# Patient Record
Sex: Male | Born: 1970 | Race: Asian | Hispanic: No | Smoking: Never smoker
Health system: Southern US, Community
[De-identification: ages and names within clinical notes are randomized; demographics above are authoritative.]

## PROBLEM LIST (undated history)

## (undated) DIAGNOSIS — F319 Bipolar disorder, unspecified: Secondary | ICD-10-CM

## (undated) DIAGNOSIS — F419 Anxiety disorder, unspecified: Secondary | ICD-10-CM

---

## 2004-05-10 ENCOUNTER — Emergency Department (HOSPITAL_COMMUNITY): Admission: EM | Admit: 2004-05-10 | Discharge: 2004-05-10 | Payer: Self-pay | Admitting: Emergency Medicine

## 2006-12-07 ENCOUNTER — Ambulatory Visit: Payer: Self-pay | Admitting: Psychiatry

## 2006-12-07 ENCOUNTER — Emergency Department (HOSPITAL_COMMUNITY): Admission: EM | Admit: 2006-12-07 | Discharge: 2006-12-07 | Payer: Self-pay | Admitting: Emergency Medicine

## 2006-12-07 ENCOUNTER — Inpatient Hospital Stay (HOSPITAL_COMMUNITY): Admission: AD | Admit: 2006-12-07 | Discharge: 2006-12-10 | Payer: Self-pay | Admitting: Psychiatry

## 2011-10-12 ENCOUNTER — Emergency Department (INDEPENDENT_AMBULATORY_CARE_PROVIDER_SITE_OTHER): Payer: BC Managed Care – PPO

## 2011-10-12 ENCOUNTER — Encounter (HOSPITAL_BASED_OUTPATIENT_CLINIC_OR_DEPARTMENT_OTHER): Payer: Self-pay | Admitting: Emergency Medicine

## 2011-10-12 ENCOUNTER — Emergency Department (HOSPITAL_BASED_OUTPATIENT_CLINIC_OR_DEPARTMENT_OTHER)
Admission: EM | Admit: 2011-10-12 | Discharge: 2011-10-12 | Disposition: A | Discharge status: Home / Self Care | Payer: BC Managed Care – PPO | Attending: Emergency Medicine | Admitting: Emergency Medicine

## 2011-10-12 DIAGNOSIS — X58XXXA Exposure to other specified factors, initial encounter: Secondary | ICD-10-CM

## 2011-10-12 DIAGNOSIS — M79609 Pain in unspecified limb: Secondary | ICD-10-CM

## 2011-10-12 DIAGNOSIS — M79673 Pain in unspecified foot: Secondary | ICD-10-CM

## 2011-10-12 DIAGNOSIS — M25579 Pain in unspecified ankle and joints of unspecified foot: Secondary | ICD-10-CM | POA: Insufficient documentation

## 2011-10-12 DIAGNOSIS — S8990XA Unspecified injury of unspecified lower leg, initial encounter: Secondary | ICD-10-CM

## 2011-10-12 MED ORDER — IBUPROFEN 600 MG PO TABS: 600.0000 mg | ORAL_TABLET | Freq: Four times a day (QID) | ORAL | Status: AC | PRN

## 2011-10-12 MED ORDER — OXYCODONE-ACETAMINOPHEN 5-325 MG PO TABS: 2.0000 | ORAL_TABLET | ORAL | Status: AC | PRN

## 2011-10-12 NOTE — ED Notes (Signed)
inj to rt foot x 3 weeks ago   Continues to be painful and swollen

## 2011-10-12 NOTE — ED Notes (Signed)
Pedal pulses noted bilat. 

## 2011-10-12 NOTE — ED Provider Notes (Signed)
History     CSN: [id number]  Arrival date & time 10/12/11  1443   First MD Initiated Contact with Patient 10/12/11 1510      Chief Complaint  Patient presents with  . Foot Injury    (Consider location/radiation/quality/duration/timing/severity/associated sxs/prior treatment) Patient is a 41 y.o. male presenting with foot injury. The history is provided by the patient.  Foot Injury    Patient presents with foot injury sustained 3 weeks ago. Has been using a foot brace for this and his symptoms were improving. Patient yesterday return to activity and now has increasing pain to the sole of his right foot. Pain is sharp and worse with standing and movement better with rest. Patient wasn't treated for the initial injury. Some swelling noted at the midfoot as well. No ankle pain. Pain does radiate up into his leg History reviewed. No pertinent past medical history.  History reviewed. No pertinent past surgical history.  Family History  Problem Relation Age of Onset  . Diabetes Mother     History  Substance Use Topics  . Smoking status: Not on file  . Smokeless tobacco: Not on file  . Alcohol Use: No      Review of Systems  All other systems reviewed and are negative.    Allergies  Review of patient's allergies indicates no known allergies.  Home Medications   Current Outpatient Rx  Name Route Sig Dispense Refill  . DIVALPROEX SODIUM ER 500 MG PO TB24 Oral Take 2,000 mg by mouth every evening.    . IBUPROFEN 200 MG PO TABS Oral Take 800 mg by mouth 3 (three) times daily as needed. For pain    . LORAZEPAM 0.5 MG PO TABS Oral Take 0.5 mg by mouth 3 (three) times daily as needed. For anxiety      BP 139/82  Pulse 93  Temp(Src) 98.1 F (36.7 C) (Oral)  Resp 20  Ht 5\' 10"  (1.778 m)  Wt 233 lb (105.688 kg)  BMI 33.43 kg/m2  SpO2 98%  Physical Exam  Nursing note and vitals reviewed. Constitutional: He is oriented to person, place, and time. He appears  well-developed and well-nourished.  Non-toxic appearance.  HENT:  Head: Normocephalic and atraumatic.  Eyes: Conjunctivae are normal. Pupils are equal, round, and reactive to light.  Neck: Normal range of motion.  Cardiovascular: Normal rate.   Pulmonary/Chest: Effort normal.  Musculoskeletal:       Right ankle: Achilles tendon normal.       Right foot: He exhibits tenderness.       Feet:  Neurological: He is alert and oriented to person, place, and time.  Skin: Skin is warm and dry.  Psychiatric: He has a normal mood and affect.    ED Course  Procedures (including critical care time)  Labs Reviewed - No data to display No results found.   No diagnosis found.    MDM  Patient to be due for suspected stress fracture of his foot. We placed in a Cam Walker and given referral to orthopedics on call        Toy Blank, MD 10/12/11 1616

## 2014-03-11 ENCOUNTER — Encounter: Payer: Self-pay | Admitting: Podiatry

## 2014-03-11 ENCOUNTER — Ambulatory Visit (INDEPENDENT_AMBULATORY_CARE_PROVIDER_SITE_OTHER): Payer: Self-pay | Admitting: Podiatry

## 2014-03-11 VITALS — Ht 69.0 in | Wt 238.0 lb

## 2014-03-11 DIAGNOSIS — L600 Ingrowing nail: Secondary | ICD-10-CM

## 2014-03-11 NOTE — Progress Notes (Signed)
   Subjective:    Patient ID: William Wilkins, male    DOB: 05/18/1969, 43 y.o.   MRN: [id number]  HPI Comments: N ingrown toenail L right 1st lateral toenail border D 1 month O worsening C encurvated thick toenail, pain and history of drainage A enclosed shoes T H202, cleansing daily     Review of Systems  Psychiatric/Behavioral: The patient is nervous/anxious.   All other systems reviewed and are negative.      Objective:   Physical Exam  Anxious orientated x3 white male presents with his wife  Vascular: DP and PT pulses 2/4 bilaterally  Neurological: Ankle reflexes equal and reactive bilaterally  Dermatological: The lateral margin of the right hallux nail is incurvated with low-grade erythema, edema and slight drainage.  Musculoskeletal: No deformities noted      Assessment & Plan:   Assessment: Ingrowing lateral margin of right hallux nail with associated low-grade paronychia  Plan: Offered patient permanent nail removal and he verbally consents. The right hallux is then blocked with 4 cc of 50-50 mixture of 2% plain Xylocaine and 0.5% plain Marcaine. Right hallux is painted with Betadine and exsanguinated. A lateral margin of the right hallux nail was excised and a phenol matricectomy performed. An antibiotic compression dressing was applied. The tourniquet was released and spontaneous capillary filling time noted in the right hallux.  Patient tolerated procedure well without any complaint.  Postoperative oral written instructions provided. Over-the-counter NSAIDs recommended for pain control.  Reappoint at patient's request

## 2014-03-11 NOTE — Patient Instructions (Addendum)
Use over-the-counter 200 mg ibuprofen tablets as needed for pain control  ANTIBACTERIAL SOAP INSTRUCTIONS  THE DAY AFTER PROCEDURE  Please follow the instructions your doctor has marked.   Shower as usual. Before getting out, place a drop of antibacterial liquid soap (Dial) on a wet, clean washcloth.  Gently wipe washcloth over affected area.  Afterward, rinse the area with warm water.  Blot the area dry with a soft cloth and cover with antibiotic ointment (neosporin, polysporin, bacitracin) and band aid or gauze and tape  Place 3-4 drops of antibacterial liquid soap in a quart of warm tap water.  Submerge foot into water for 20 minutes.  If bandage was applied after your procedure, leave on to allow for easy lift off, then remove and continue with soak for the remaining time.  Next, blot area dry with a soft cloth and cover with a bandage.  Apply other medications as directed by your doctor, such as cortisporin otic solution (eardrops) or neosporin antibiotic ointment

## 2014-03-12 ENCOUNTER — Encounter (HOSPITAL_BASED_OUTPATIENT_CLINIC_OR_DEPARTMENT_OTHER): Payer: Self-pay | Admitting: Emergency Medicine

## 2018-03-18 ENCOUNTER — Encounter: Payer: Self-pay | Admitting: Podiatry

## 2018-03-18 ENCOUNTER — Ambulatory Visit: Payer: Self-pay | Admitting: Podiatry

## 2018-03-18 DIAGNOSIS — L600 Ingrowing nail: Secondary | ICD-10-CM

## 2018-03-18 MED ORDER — CEPHALEXIN 500 MG PO CAPS: 500.0000 mg | ORAL_CAPSULE | Freq: Three times a day (TID) | ORAL | 0 refills | Status: DC

## 2018-03-18 NOTE — Patient Instructions (Signed)

## 2018-03-19 DIAGNOSIS — L600 Ingrowing nail: Secondary | ICD-10-CM | POA: Insufficient documentation

## 2018-03-19 NOTE — Progress Notes (Signed)
Subjective:   Patient ID: William Wilkins, male   DOB: 47 y.o.   MRN: [id number]   HPI 47 year old male presents the office today for concerns of a painful ingrown toenail to left great toe, pointing the lateral aspect which is been ongoing for more than 3 weeks has been getting worse.  The area is very painful with pressure in shoes.  He did have some drainage coming from the area.  Denies any red streaks.  He thinks this started because he is wearing the wrong size shoe.  He was always wearing a size 2 small he recently got new shoes when he realized this.  He states that the toenail on the left side has been hurting quite a bit.  He has no other concerns.   Review of Systems  All other systems reviewed and are negative.  History reviewed. No pertinent past medical history.  History reviewed. No pertinent surgical history.   Current Outpatient Medications:  .  cephALEXin (KEFLEX) 500 MG capsule, Take 1 capsule (500 mg total) by mouth 3 (three) times daily., Disp: 30 capsule, Rfl: 0 .  divalproex (DEPAKOTE ER) 500 MG 24 hr tablet, Take 2,000 mg by mouth every evening., Disp: , Rfl:  .  ibuprofen (ADVIL,MOTRIN) 200 MG tablet, Take 800 mg by mouth 3 (three) times daily as needed. For pain, Disp: , Rfl:  .  ibuprofen (ADVIL,MOTRIN) 200 MG tablet, Take 800 mg by mouth every 8 (eight) hours as needed., Disp: , Rfl:  .  LORazepam (ATIVAN) 0.5 MG tablet, Take 0.5 mg by mouth 3 (three) times daily as needed. For anxiety, Disp: , Rfl:   Allergies  Allergen Reactions  . Other     cats         Objective:  Physical Exam  General: AAO x3, NAD  Dermatological: The left hallux toenail is hypertrophic, dystrophic with yellow discoloration.  Is significantly curved and there is incurvation present within the skin on the lateral aspect the left hallux.  There is localized edema and erythema there is no drainage or pus identified today and there is no ascending cellulitis.  No fluctuation or  crepitation.  No other open lesions.  Vascular: Dorsalis Pedis artery and Posterior Tibial artery pedal pulses are 2/4 bilateral with immedate capillary fill time. There is no pain with calf compression, swelling, warmth, erythema.   Neruologic: Grossly intact via light touch bilateral.  Protective threshold with Semmes Wienstein monofilament intact to all pedal sites bilateral.   Musculoskeletal: Tenderness palpation to the lateral aspect the left hallux toenail. No gross boney pedal deformities bilateral. No pain, crepitus, or limitation noted with foot and ankle range of motion bilateral. Muscular strength 5/5 in all groups tested bilateral.  Gait: Unassisted, Nonantalgic.      Assessment:   47 year old male left lateral hallux Intermedic ingrown toenail     Plan:  -Treatment options discussed including all alternatives, risks, and complications -Etiology of symptoms were discussed -At this time, the patient is requesting partial nail removal with chemical matricectomy to the symptomatic portion of the nail. Risks and complications were discussed with the patient for which they understand and written consent was obtained. Under sterile conditions a total of 3 mL of a mixture of 2% lidocaine plain and 0.5% Marcaine plain was infiltrated in a hallux block fashion. Once anesthetized, the skin was prepped in sterile fashion. A tourniquet was then applied. Next the lateral aspect of hallux nail border was then sharply excised making sure to remove  the entire offending nail border. Once the nails were ensured to be removed area was debrided and the underlying skin was intact. There is no purulence identified in the procedure. Next phenol was then applied under standard conditions and copiously irrigated. Silvadene was applied. A dry sterile dressing was applied. After application of the dressing the tourniquet was removed and there is found to be an immediate capillary refill time to the digit. The  patient tolerated the procedure well any complications. Post procedure instructions were discussed the patient for which he verbally understood. Follow-up in one week for nail check or sooner if any problems are to arise. Discussed signs/symptoms of infection and directed to call the office immediately should any occur or go directly to the emergency room. In the meantime, encouraged to call the office with any questions, concerns, changes symptoms. -Keflex  Vivi BarrackMatthew R Mahlia Fernando DPM

## 2018-03-25 ENCOUNTER — Encounter: Payer: 59 | Admitting: Podiatry

## 2018-03-26 NOTE — Progress Notes (Signed)
This encounter was created in error - please disregard.

## 2018-07-11 ENCOUNTER — Ambulatory Visit (INDEPENDENT_AMBULATORY_CARE_PROVIDER_SITE_OTHER): Payer: BLUE CROSS/BLUE SHIELD | Admitting: Mental Health

## 2018-07-11 ENCOUNTER — Encounter: Payer: Self-pay | Admitting: Mental Health

## 2018-07-11 DIAGNOSIS — F3181 Bipolar II disorder: Secondary | ICD-10-CM
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
## 2018-07-15 ENCOUNTER — Ambulatory Visit: Payer: Self-pay | Admitting: Mental Health

## 2018-07-22 ENCOUNTER — Ambulatory Visit: Payer: BLUE CROSS/BLUE SHIELD | Admitting: Mental Health

## 2019-01-02 ENCOUNTER — Other Ambulatory Visit: Payer: Self-pay

## 2019-01-02 ENCOUNTER — Encounter: Payer: Self-pay | Admitting: Psychiatry

## 2019-01-02 ENCOUNTER — Ambulatory Visit (INDEPENDENT_AMBULATORY_CARE_PROVIDER_SITE_OTHER): Payer: BLUE CROSS/BLUE SHIELD | Admitting: Psychiatry

## 2019-01-02 DIAGNOSIS — F3181 Bipolar II disorder: Secondary | ICD-10-CM

## 2019-01-02 DIAGNOSIS — F411 Generalized anxiety disorder: Secondary | ICD-10-CM | POA: Diagnosis not present

## 2019-01-02 MED ORDER — DIVALPROEX SODIUM 250 MG PO DR TAB
750.0000 mg | DELAYED_RELEASE_TABLET | Freq: Two times a day (BID) | ORAL | 10 refills | Status: DC
Start: 2019-01-02 — End: 2020-01-09
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
## 2019-06-03 ENCOUNTER — Emergency Department (HOSPITAL_COMMUNITY)
Admission: EM | Admit: 2019-06-03 | Discharge: 2019-06-03 | Disposition: A | Discharge status: Home / Self Care | Payer: Worker's Compensation | Attending: Emergency Medicine | Admitting: Emergency Medicine

## 2019-06-03 ENCOUNTER — Emergency Department (HOSPITAL_COMMUNITY): Payer: Worker's Compensation

## 2019-06-03 ENCOUNTER — Encounter (HOSPITAL_COMMUNITY): Payer: Self-pay | Admitting: Emergency Medicine

## 2019-06-03 ENCOUNTER — Other Ambulatory Visit: Payer: Self-pay

## 2019-06-03 DIAGNOSIS — Y990 Civilian activity done for income or pay: Secondary | ICD-10-CM | POA: Insufficient documentation

## 2019-06-03 DIAGNOSIS — X500XXA Overexertion from strenuous movement or load, initial encounter: Secondary | ICD-10-CM | POA: Insufficient documentation

## 2019-06-03 DIAGNOSIS — Y9389 Activity, other specified: Secondary | ICD-10-CM | POA: Diagnosis not present

## 2019-06-03 DIAGNOSIS — M5442 Lumbago with sciatica, left side: Secondary | ICD-10-CM | POA: Diagnosis not present

## 2019-06-03 DIAGNOSIS — Z79899 Other long term (current) drug therapy: Secondary | ICD-10-CM | POA: Diagnosis not present

## 2019-06-03 DIAGNOSIS — M545 Low back pain: Secondary | ICD-10-CM | POA: Diagnosis present

## 2019-06-03 DIAGNOSIS — Y9259 Other trade areas as the place of occurrence of the external cause: Secondary | ICD-10-CM | POA: Diagnosis not present

## 2019-06-03 MED ORDER — OXYCODONE-ACETAMINOPHEN 5-325 MG PO TABS: 1.0000 | ORAL_TABLET | Freq: Four times a day (QID) | ORAL | 0 refills | Status: DC | PRN

## 2019-06-03 MED ORDER — HYDROMORPHONE HCL 1 MG/ML IJ SOLN
0.5000 mg | Freq: Once | INTRAMUSCULAR | Status: AC
  Administered 2019-06-03: 16:00:00 0.5 mg via INTRAVENOUS
  Filled 2019-06-03: qty 1

## 2019-06-03 MED ORDER — KETOROLAC TROMETHAMINE 15 MG/ML IJ SOLN
15.0000 mg | Freq: Once | INTRAMUSCULAR | Status: AC
  Administered 2019-06-03: 15 mg via INTRAVENOUS
  Filled 2019-06-03: qty 1

## 2019-06-03 MED ORDER — CYCLOBENZAPRINE HCL 5 MG PO TABS: 5.0000 mg | ORAL_TABLET | Freq: Two times a day (BID) | ORAL | 0 refills | Status: DC | PRN

## 2019-06-03 MED ORDER — HYDROMORPHONE HCL 1 MG/ML IJ SOLN
0.5000 mg | Freq: Once | INTRAMUSCULAR | Status: AC
  Administered 2019-06-03: 0.5 mg via INTRAVENOUS
  Filled 2019-06-03: qty 1

## 2019-06-03 NOTE — ED Notes (Signed)
Pt ambulated in room w/ no assistance, minor pain.

## 2019-06-03 NOTE — ED Triage Notes (Signed)
Pt arrives via EMS from work with sudden onset of severe lumbar spinal pain after lifting left leg numbness, tingling, pain. VSS. Pt alert, oriented x4. Pt with hx of bulging disks.

## 2019-06-03 NOTE — ED Notes (Signed)
Patient transported to X-ray 

## 2019-06-03 NOTE — Discharge Instructions (Addendum)
Please read attached information. If you experience any new or worsening signs or symptoms please return to the emergency room for evaluation. Please follow-up with your primary care provider or specialist as discussed. Please use medication prescribed only as directed and discontinue taking if you have any concerning signs or symptoms.   °

## 2019-06-03 NOTE — ED Provider Notes (Signed)
[facility] Provider Note   CSN: [id number] Arrival date & time: 06/03/19  1341     History   Chief Complaint Chief Complaint  Patient presents with  . Back Pain    HPI William Wilkins is a 48 y.o. male.     HPI  48 year old male presents today with complaints of acute back pain.  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  He notes loss of sensation with pain in the left leg.  He denies any associated abdominal pain or chest pain.  Patient notes he fell to the ground and was unable to get back up.  EMS was called.  He notes a sensation has gradually improved in his leg, he has full sensation in the leg full active range of motion at the leg, some pain with straight leg raise.  He denies any history of IV drug use fever or any acute trauma other than that noted above.  No medications prior to arrival.  History reviewed. No pertinent past medical history.  Patient Active Problem List   Diagnosis Date Noted  . Moderate bipolar II disorder, depressed, in partial remission, with atypical features (HCC) 01/02/2019  . Generalized anxiety disorder 01/02/2019  . Ingrown toenail 03/19/2018    History reviewed. No pertinent surgical history.      Home Medications    Prior to Admission medications   Medication Sig Start Date End Date Taking? Authorizing Provider  cephALEXin (KEFLEX) 500 MG capsule Take 1 capsule (500 mg total) by mouth 3 (three) times daily. 03/18/18   Vivi BarrackWagoner, Matthew R, DPM  divalproex (DEPAKOTE) 250 MG DR tablet Take 3 tablets (750 mg total) by mouth 2 (two) times daily. 01/02/19   Chauncey MannJennings, Glenn E, MD  ibuprofen (ADVIL,MOTRIN) 200 MG tablet Take 800 mg by mouth 3 (three) times daily as needed. For pain    [provider]  ibuprofen  (ADVIL,MOTRIN) 200 MG tablet Take 800 mg by mouth every 8 (eight) hours as needed.    [provider]  LORazepam (ATIVAN) 0.5 MG tablet Take 0.5 mg by mouth 3 (three) times daily as needed. For anxiety    [provider]    Family History Family History  Problem Relation Age of Onset  . Diabetes Mother   . Hypertension Mother     Social History Social History   Tobacco Use  . Smoking status: Never Smoker  . Smokeless tobacco: Never Used  Substance Use Topics  . Alcohol use: No  . Drug use: No     Allergies   Other   Review of Systems Review of Systems  All other systems reviewed and are negative.    Physical Exam Updated Vital Signs BP 128/79   Pulse 70   Temp 98.4 F (36.9 C) (Oral)   Resp 16   Ht 5\' 2"  (1.575 m)   Wt 106.6 kg   SpO2 98%   BMI 42.98 kg/m   Physical Exam Vitals signs and nursing note reviewed.  Constitutional:      Appearance: He is well-developed.  HENT:     Head: Normocephalic and atraumatic.  Eyes:     General: No scleral icterus.       Right eye: No discharge.  Left eye: No discharge.     Conjunctiva/sclera: Conjunctivae normal.     Pupils: Pupils are equal, round, and reactive to light.  Neck:     Musculoskeletal: Normal range of motion.     Vascular: No JVD.     Trachea: No tracheal deviation.  Pulmonary:     Effort: Pulmonary effort is normal.     Breath sounds: No stridor.  Abdominal:     General: Abdomen is flat. There is no distension.     Palpations: Abdomen is soft.  Musculoskeletal:     Comments: Minimal tenderness to palpation of mid lower lumbar region and left upper gluteus, sensation strength and motor function is intact of the bilateral lower extremities, straight leg is negative bilateral-the patient does have some tightness in the left posterior thigh with straight leg raise  Neurological:     Mental Status: He is alert and oriented to person, place, and time.     Coordination:  Coordination normal.  Psychiatric:        Behavior: Behavior normal.        Thought Content: Thought content normal.        Judgment: Judgment normal.      ED Treatments / Results  Labs (all labs ordered are listed, but only abnormal results are displayed) Labs Reviewed - No data to display  EKG None  Radiology Dg Lumbar Spine Complete  Result Date: 06/03/2019 CLINICAL DATA:  Low back pain following heavy lifting, initial encounter EXAM: LUMBAR SPINE - COMPLETE 4+ VIEW COMPARISON:  None. FINDINGS: Five lumbar type vertebral bodies are well visualized. Mild disc space narrowing at L4-5 is noted. No pars defects are seen. No anterolisthesis is noted. No soft tissue abnormality is noted. IMPRESSION: Mild degenerative change without acute abnormality. Electronically Signed   By: Inez Catalina M.D.   On: 06/03/2019 14:30    Procedures Procedures (including critical care time)  Medications Ordered in ED Medications  HYDROmorphone (DILAUDID) injection 0.5 mg (0.5 mg Intravenous Given 06/03/19 1539)  ketorolac (TORADOL) 15 MG/ML injection 15 mg (15 mg Intravenous Given 06/03/19 1815)  HYDROmorphone (DILAUDID) injection 0.5 mg (0.5 mg Intravenous Given 06/03/19 1814)     Initial Impression / Assessment and Plan / ED Course  I have reviewed the triage vital signs and the nursing notes.  Pertinent labs & imaging results that were available during my care of the patient were reviewed by me and considered in my medical decision making (see chart for details).        48 year old male presents today with acute back pain.  Patient had acute loss of sensation in the lower extremity which has completely resolved.  He was given pain medication here and ambulates with minimal difficulty.  I see no signs of acute life-threatening etiology today.  Patient discharged with pain medicine and muscle relaxers and appropriate precautions for these.  He will follow-up as an outpatient with orthopedist if  symptoms continue to persist and return immediately if they worsen.  He verbalized understanding and agreement to today's plan had no further questions or concerns at the time of discharge.  Final Clinical Impressions(s) / ED Diagnoses   Final diagnoses:  Acute midline low back pain with left-sided sciatica    ED Discharge Orders    None       Francee Gentile 06/03/19 2012    Deno Etienne, DO 06/03/19 2309

## 2019-06-09 ENCOUNTER — Encounter (HOSPITAL_COMMUNITY): Payer: Self-pay | Admitting: Emergency Medicine

## 2019-06-09 ENCOUNTER — Emergency Department (HOSPITAL_COMMUNITY)
Admission: EM | Admit: 2019-06-09 | Discharge: 2019-06-09 | Disposition: A | Discharge status: Home / Self Care | Payer: Worker's Compensation | Attending: Emergency Medicine | Admitting: Emergency Medicine

## 2019-06-09 ENCOUNTER — Other Ambulatory Visit: Payer: Self-pay

## 2019-06-09 DIAGNOSIS — M545 Low back pain, unspecified: Secondary | ICD-10-CM

## 2019-06-09 DIAGNOSIS — Z79899 Other long term (current) drug therapy: Secondary | ICD-10-CM | POA: Diagnosis not present

## 2019-06-09 HISTORY — DX: Anxiety disorder, unspecified: F41.9

## 2019-06-09 HISTORY — DX: Bipolar disorder, unspecified: F31.9

## 2019-06-09 MED ORDER — LIDOCAINE 5 % EX PTCH
1.0000 | MEDICATED_PATCH | Freq: Once | CUTANEOUS | Status: DC
  Administered 2019-06-09: 1 via TRANSDERMAL
  Filled 2019-06-09: qty 1

## 2019-06-09 MED ORDER — KETOROLAC TROMETHAMINE 30 MG/ML IJ SOLN
30.0000 mg | Freq: Once | INTRAMUSCULAR | Status: AC
  Administered 2019-06-09: 30 mg via INTRAVENOUS
  Filled 2019-06-09: qty 1

## 2019-06-09 MED ORDER — LIDOCAINE 5 % EX PTCH: 1.0000 | MEDICATED_PATCH | CUTANEOUS | 0 refills | Status: DC

## 2019-06-09 MED ORDER — METHYLPREDNISOLONE SODIUM SUCC 40 MG IJ SOLR
40.0000 mg | Freq: Once | INTRAMUSCULAR | Status: AC
  Administered 2019-06-09: 40 mg via INTRAMUSCULAR
  Filled 2019-06-09: qty 1

## 2019-06-09 NOTE — ED Triage Notes (Signed)
Pt reports continued low back pain. Seen here 9/8 for same. REports PCP wants MRI. Pt ambulatory. Moving all extremities. Denies numbness/tingling, bowel/bladder incontinence. VSS. Pain 6/10.

## 2019-06-09 NOTE — Discharge Instructions (Addendum)
Your back pain should be treated with medicines such as ibuprofen or tylenol and this back pain should get better over the next 2 weeks.  -I have not prescribed any new narcotic or muscle relaxer medications at today's visit.  You should continue to take the ones you have at home as prescribed.  Follow Up: Please follow up with your primary healthcare provider in 1 week. -Also recommend you have your blood pressure rechecked by primary care doctor follow-up visit.  It was slightly elevated today, I suspect this was because you were having pain.  Low back pain is discomfort in the lower back that may be due to injuries to muscles and ligaments around the spine. Occasionally, it may be caused by a a problem to a part of the spine called a disc. The pain may last several days or a week;  However, most patients get completely well in 4 weeks.   1. Medications: Alternate 600 mg of ibuprofen and [phone] mg of Tylenol every 3 hours as needed for pain. Do not exceed 4000 mg of Tylenol daily.  Take ibuprofen with food to avoid upset stomach issues.   Muscle relaxants:  These medications can help with muscle tightness that is a cause of lower back pain. Most of these medications can cause drowsiness, and it is not safe to drive or use dangerous machinery while taking them.You can take Flexeril as needed for muscle spasm up to twice daily but do not drive, drink alcohol, or operate heavy machinery while taking this medicine because it may make you drowsy.  I typically recommend taking this medicine only at night when you are going to sleep.  You can also cut these tablets in half if they make you feel very drowsy.  2. Treatment: rest, drink plenty of fluids, gentle stretching as discussed (see attached), alternate ice and heat (or stick with whichever feels best) 20 minutes on 20 minutes off. Maintaining your daily activities, including walking, is encourged, as it will help you get better faster than just  staying in bed.    Be aware that if you develop new symptoms, such as a fever, leg weakness, difficulty with or loss of control of your urine or bowels, abdominal pain, or more severe pain, you will need to seek medical attention immediately and  / or return to the Emergency department.

## 2019-06-09 NOTE — ED Provider Notes (Signed)
[facility] EMERGENCY DEPARTMENT Provider Note   CSN: [id number] Arrival date & time: 06/09/19  1050     History   Chief Complaint Chief Complaint  Patient presents with  . Back Pain    HPI William Wilkins is a 48 y.o. male past medical history of anxiety and bipolar 1 disorder presents to emergency department today with chief complaint of back pain.  This is been going on x6 days.  Patient states low back pain started after lifting a microwave and turning quickly to get through a door at work.  He was seen in the emergency department immediately after injury.  At the ED visit he had a lumbar x-ray that showed degenerative changes without acute injury.  He was treated with IM pain medicine and Toradol.  Pain improved and he was discharged home. He reports at home he has been taking Flexeril and Percocet as prescribed.  He has transient pain relief.  He scheduled a follow-up PCP appointment and when calling to confirm was recommended to come to the emergency room for further evaluation as he was continuing to have back pain. Today he is reporting pain located in bilateral paraspinal muscles of lumbar spine.  He feels like his muscles are very tight in his back.  The pain does not radiate.  He rates it 7 out of 10 in severity.  He denies any new injuries or falls.   Denies fevers, weight loss, numbness/weakness of upper and lower extremities, bowel/bladder incontinence, urinary retention, history of cancer, saddle anesthesia, history of back surgery, history of IVDA. History provided by patient with additional history obtained from chart review.       Past Medical History:  Diagnosis Date  . Anxiety   . Bipolar 1 disorder [facility])     Patient Active Problem List   Diagnosis Date Noted  . Moderate bipolar II disorder, depressed, in partial remission, with atypical features (HCC) 01/02/2019  . Generalized anxiety disorder 01/02/2019  . Ingrown toenail 03/19/2018     History reviewed. No pertinent surgical history.      Home Medications    Prior to Admission medications   Medication Sig Start Date End Date Taking? Authorizing Provider  cephALEXin (KEFLEX) 500 MG capsule Take 1 capsule (500 mg total) by mouth 3 (three) times daily. 03/18/18   Vivi Barrack, DPM  cyclobenzaprine (FLEXERIL) 5 MG tablet Take 1 tablet (5 mg total) by mouth 2 (two) times daily as needed for muscle spasms. 06/03/19   Hedges, Tinnie Gens, PA-C  divalproex (DEPAKOTE) 250 MG DR tablet Take 3 tablets (750 mg total) by mouth 2 (two) times daily. 01/02/19   Chauncey Mann, MD  ibuprofen (ADVIL,MOTRIN) 200 MG tablet Take 800 mg by mouth 3 (three) times daily as needed. For pain    [provider]  ibuprofen (ADVIL,MOTRIN) 200 MG tablet Take 800 mg by mouth every 8 (eight) hours as needed.    [provider]  lidocaine (LIDODERM) 5 % Place 1 patch onto the skin daily. Remove & Discard patch within 12 hours or as directed by MD 06/09/19   Jossie Smoot, Yvonna Alanis E, PA-C  LORazepam (ATIVAN) 0.5 MG tablet Take 0.5 mg by mouth 3 (three) times daily as needed. For anxiety    [provider]  oxyCODONE-acetaminophen (PERCOCET/ROXICET) 5-325 MG tablet Take 1 tablet by mouth every 6 (six) hours as needed for severe pain. 06/03/19   Eyvonne Mechanic, PA-C    Family History Family History  Problem Relation Age  of Onset  . Diabetes Mother   . Hypertension Mother     Social History Social History   Tobacco Use  . Smoking status: Never Smoker  . Smokeless tobacco: Never Used  Substance Use Topics  . Alcohol use: No  . Drug use: No     Allergies   Other   Review of Systems Review of Systems  Constitutional: Negative for chills and fever.  HENT: Negative for congestion, rhinorrhea, sinus pressure and sore throat.   Eyes: Negative for pain and redness.  Respiratory: Negative for cough, shortness of breath and wheezing.   Cardiovascular: Negative for chest pain  and palpitations.  Gastrointestinal: Negative for abdominal pain, constipation, diarrhea, nausea and vomiting.  Genitourinary: Negative for difficulty urinating, dysuria, frequency and urgency.  Musculoskeletal: Positive for back pain. Negative for arthralgias, gait problem, myalgias and neck pain.  Skin: Negative for rash and wound.  Neurological: Negative for dizziness, syncope, weakness, numbness and headaches.  Psychiatric/Behavioral: Negative for confusion.     Physical Exam Updated Vital Signs BP (!) 147/101 (BP Location: Right Arm)   Pulse 85   Temp 98.8 F (37.1 C) (Oral)   Resp 16   Ht 5\' 10"  (1.778 m)   Wt 106.6 kg   SpO2 98%   BMI 33.72 kg/m   Physical Exam Vitals signs and nursing note reviewed.  Constitutional:      General: He is not in acute distress.    Appearance: He is not ill-appearing.  HENT:     Head: Normocephalic and atraumatic.     Right Ear: Tympanic membrane and external ear normal.     Left Ear: Tympanic membrane and external ear normal.     Nose: Nose normal.     Mouth/Throat:     Mouth: Mucous membranes are moist.     Pharynx: Oropharynx is clear.  Eyes:     General: No scleral icterus.       Right eye: No discharge.        Left eye: No discharge.     Extraocular Movements: Extraocular movements intact.     Conjunctiva/sclera: Conjunctivae normal.     Pupils: Pupils are equal, round, and reactive to light.  Neck:     Musculoskeletal: Normal range of motion.     Vascular: No JVD.  Cardiovascular:     Rate and Rhythm: Normal rate and regular rhythm.     Pulses: Normal pulses.          Radial pulses are 2+ on the right side and 2+ on the left side.       Dorsalis pedis pulses are 2+ on the right side and 2+ on the left side.     Heart sounds: Normal heart sounds.  Pulmonary:     Comments: Lungs clear to auscultation in all fields. Symmetric chest rise. No wheezing, rales, or rhonchi. Abdominal:     Comments: Abdomen is soft,  non-distended, and non-tender in all quadrants. No rigidity, no guarding. No peritoneal signs.  Musculoskeletal: Normal range of motion.     Comments: Full range of motion of the T-spine and L-spine No tenderness to palpation of the spinous processes of the T-spine or L-spine No crepitus, deformity or step-offs Tenderness to palpation of bilateral paraspinous muscles of the L-spine. Negative straight leg raise test  Full ROM of bilateral hips, knees, ankles.  Skin:    General: Skin is warm and dry.     Capillary Refill: Capillary refill takes less than 2 seconds.  Neurological:     Mental Status: He is oriented to person, place, and time.     GCS: GCS eye subscore is 4. GCS verbal subscore is 5. GCS motor subscore is 6.     Comments: Fluent speech, no facial droop.  Psychiatric:        Mood and Affect: Mood is anxious. Affect is tearful.        Behavior: Behavior normal.      ED Treatments / Results  Labs (all labs ordered are listed, but only abnormal results are displayed) Labs Reviewed - No data to display  EKG None  Radiology No results found.  Procedures Procedures (including critical care time)  Medications Ordered in ED Medications  lidocaine (LIDODERM) 5 % 1 patch (has no administration in time range)  ketorolac (TORADOL) 30 MG/ML injection 30 mg (has no administration in time range)  methylPREDNISolone sodium succinate (SOLU-MEDROL) 40 mg/mL injection 40 mg (has no administration in time range)     Initial Impression / Assessment and Plan / ED Course  I have reviewed the triage vital signs and the nursing notes.  Pertinent labs & imaging results that were available during my care of the patient were reviewed by me and considered in my medical decision making (see chart for details).  Patient with back pain.  He appears anxious and is tearful during exam. No neurological deficits and normal neuro exam. I reviewed patient's recent ED visit and looked at X-rays  of lumbar spine. Given reassuring exam, I do not feel xray or MRI are warranted at this visit. Patient can walk but states is painful.  No loss of bowel or bladder control.  No concern for cauda equina.  No fever, night sweats, weight loss, h/o cancer, IVDU.  RICE protocol and symptom management discussed with patient and spouse. They agree with plan. Pt agrees to follow up with pcp.  Also recommend patient have blood pressure rechecked by PCP as it was elevated today.  I suspect it was elevated secondary to pain.  The patient appears reasonably screened and/or stabilized for discharge and I doubt any other medical condition or other [facility] requiring further screening, evaluation, or treatment in the ED at this time prior to discharge. The patient is safe for discharge with strict return precautions discussed.    Vitals:   06/09/19 1145 06/09/19 1200 06/09/19 1215 06/09/19 1230  BP: (!) 140/99 (!) 130/100 (!) 131/100 (!) 140/98  Pulse: 86 82 76 86  Resp:      Temp:      TempSrc:      SpO2: 98% 97% 95% 96%  Weight:      Height:         Final Clinical Impressions(s) / ED Diagnoses   Final diagnoses:  Acute bilateral low back pain without sciatica    ED Discharge Orders         Ordered    lidocaine (LIDODERM) 5 %  Every 24 hours     06/09/19 1215           Destony Prevost, Harley Hallmark, PA-C 06/09/19 1254    Lacretia Leigh, MD 06/10/19 1557

## 2020-01-09 ENCOUNTER — Other Ambulatory Visit: Payer: Self-pay | Admitting: Psychiatry

## 2020-01-09 DIAGNOSIS — F411 Generalized anxiety disorder: Secondary | ICD-10-CM

## 2020-01-09 DIAGNOSIS — F3181 Bipolar II disorder: Secondary | ICD-10-CM

## 2020-01-09 NOTE — Telephone Encounter (Signed)
Last apt a year ago 01/02/2019

## 2020-02-10 ENCOUNTER — Other Ambulatory Visit: Payer: Self-pay | Admitting: Psychiatry

## 2020-02-10 DIAGNOSIS — F3181 Bipolar II disorder: Secondary | ICD-10-CM

## 2020-02-10 DIAGNOSIS — F411 Generalized anxiety disorder: Secondary | ICD-10-CM

## 2020-02-10 NOTE — Telephone Encounter (Signed)
Has been over a year since last apt 12/2018

## 2020-02-18 ENCOUNTER — Ambulatory Visit: Payer: BLUE CROSS/BLUE SHIELD | Admitting: Psychiatry

## 2020-02-19 ENCOUNTER — Other Ambulatory Visit: Payer: Self-pay

## 2020-02-19 ENCOUNTER — Ambulatory Visit (INDEPENDENT_AMBULATORY_CARE_PROVIDER_SITE_OTHER): Payer: BLUE CROSS/BLUE SHIELD | Admitting: Psychiatry

## 2020-02-19 ENCOUNTER — Encounter: Payer: Self-pay | Admitting: Psychiatry

## 2020-02-19 VITALS — Ht 70.0 in | Wt 260.0 lb

## 2020-02-19 DIAGNOSIS — F411 Generalized anxiety disorder: Secondary | ICD-10-CM | POA: Diagnosis not present

## 2020-02-19 DIAGNOSIS — F3181 Bipolar II disorder: Secondary | ICD-10-CM

## 2020-02-19 MED ORDER — DIVALPROEX SODIUM 250 MG PO DR TAB: 750.0000 mg | DELAYED_RELEASE_TABLET | Freq: Two times a day (BID) | ORAL | 11 refills | Status: DC
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
## 2020-03-06 ENCOUNTER — Other Ambulatory Visit: Payer: Self-pay | Admitting: Psychiatry

## 2020-03-06 ENCOUNTER — Telehealth: Payer: Self-pay | Admitting: Psychiatry

## 2020-03-06 DIAGNOSIS — F411 Generalized anxiety disorder: Secondary | ICD-10-CM

## 2020-03-06 DIAGNOSIS — F3181 Bipolar II disorder: Secondary | ICD-10-CM

## 2020-03-06 MED ORDER — DIVALPROEX SODIUM 250 MG PO DR TAB: 750.0000 mg | DELAYED_RELEASE_TABLET | Freq: Two times a day (BID) | ORAL | 11 refills | Status: DC

## 2020-03-06 NOTE — Telephone Encounter (Signed)
After hours phone call from patient to have Depakote of 02/19/2020 E scribed to [facility] telling him they did not get the prescription.  As I review the appointment in Epic, he had the prescription to [facility] than to [facility].  I canceled by phone message to [facility] the eScription 02/19/2020 and E scribed instead Depakote 250 mg DR to take 3 tablets twice daily as #180 and 11 refills to [facility], patient's phone if needed [phone]

## 2020-05-16 ENCOUNTER — Encounter (HOSPITAL_COMMUNITY): Payer: Self-pay | Admitting: Emergency Medicine

## 2020-05-16 ENCOUNTER — Emergency Department (HOSPITAL_COMMUNITY)
Admission: EM | Admit: 2020-05-16 | Discharge: 2020-05-17 | Disposition: A | Discharge status: Home / Self Care | Payer: BLUE CROSS/BLUE SHIELD | Attending: Emergency Medicine | Admitting: Emergency Medicine

## 2020-05-16 ENCOUNTER — Other Ambulatory Visit: Payer: Self-pay

## 2020-05-16 DIAGNOSIS — R258 Other abnormal involuntary movements: Secondary | ICD-10-CM | POA: Insufficient documentation

## 2020-05-16 DIAGNOSIS — G478 Other sleep disorders: Secondary | ICD-10-CM | POA: Insufficient documentation

## 2020-05-16 DIAGNOSIS — Z046 Encounter for general psychiatric examination, requested by authority: Secondary | ICD-10-CM

## 2020-05-16 DIAGNOSIS — Z20822 Contact with and (suspected) exposure to covid-19: Secondary | ICD-10-CM | POA: Insufficient documentation

## 2020-05-16 DIAGNOSIS — F319 Bipolar disorder, unspecified: Secondary | ICD-10-CM | POA: Insufficient documentation

## 2020-05-16 DIAGNOSIS — F430 Acute stress reaction: Secondary | ICD-10-CM | POA: Diagnosis not present

## 2020-05-16 DIAGNOSIS — F3132 Bipolar disorder, current episode depressed, moderate: Secondary | ICD-10-CM | POA: Diagnosis not present

## 2020-05-16 LAB — CBC WITH DIFFERENTIAL/PLATELET
Abs Immature Granulocytes: 0.05 10*3/uL (ref 0.00–0.07)
Basophils Absolute: 0.1 10*3/uL (ref 0.0–0.1)
Basophils Relative: 1 %
Eosinophils Absolute: 0.3 10*3/uL (ref 0.0–0.5)
Eosinophils Relative: 3 %
HCT: 47 % (ref 39.0–52.0)
Hemoglobin: 16 g/dL (ref 13.0–17.0)
Immature Granulocytes: 1 %
Lymphocytes Relative: 41 %
Lymphs Abs: 3.9 10*3/uL (ref 0.7–4.0)
MCH: 31.3 pg (ref 26.0–34.0)
MCHC: 34 g/dL (ref 30.0–36.0)
MCV: 92 fL (ref 80.0–100.0)
Monocytes Absolute: 1 10*3/uL (ref 0.1–1.0)
Monocytes Relative: 10 %
Neutro Abs: 4.2 10*3/uL (ref 1.7–7.7)
Neutrophils Relative %: 44 %
Platelets: 235 10*3/uL (ref 150–400)
RBC: 5.11 MIL/uL (ref 4.22–5.81)
RDW: 12.6 % (ref 11.5–15.5)
WBC: 9.5 10*3/uL (ref 4.0–10.5)
nRBC: 0 % (ref 0.0–0.2)

## 2020-05-16 LAB — COMPREHENSIVE METABOLIC PANEL
ALT: 103 U/L — ABNORMAL HIGH (ref 0–44)
AST: 58 U/L — ABNORMAL HIGH (ref 15–41)
Albumin: 4.3 g/dL (ref 3.5–5.0)
Alkaline Phosphatase: 47 U/L (ref 38–126)
Anion gap: 8 (ref 5–15)
BUN: 12 mg/dL (ref 6–20)
CO2: 26 mmol/L (ref 22–32)
Calcium: 8.8 mg/dL — ABNORMAL LOW (ref 8.9–10.3)
Chloride: 105 mmol/L (ref 98–111)
Creatinine, Ser: 0.73 mg/dL (ref 0.61–1.24)
GFR calc Af Amer: >60 mL/min (ref >60)
GFR calc non Af Amer: >60 mL/min (ref >60)
Glucose, Bld: 116 mg/dL — ABNORMAL HIGH (ref 70–99)
Potassium: 4.5 mmol/L (ref 3.5–5.1)
Sodium: 139 mmol/L (ref 135–145)
Total Bilirubin: 0.6 mg/dL (ref 0.3–1.2)
Total Protein: 7.4 g/dL (ref 6.5–8.1)

## 2020-05-16 LAB — SARS CORONAVIRUS 2 BY RT PCR (HOSPITAL ORDER, PERFORMED IN ~~LOC~~ HOSPITAL LAB): SARS Coronavirus 2: NEGATIVE

## 2020-05-16 LAB — RAPID URINE DRUG SCREEN, HOSP PERFORMED
Amphetamines: NOT DETECTED
Barbiturates: NOT DETECTED
Benzodiazepines: NOT DETECTED
Cocaine: NOT DETECTED
Opiates: NOT DETECTED
Tetrahydrocannabinol: NOT DETECTED

## 2020-05-16 LAB — ETHANOL: Alcohol, Ethyl (B): <10 mg/dL (ref <10)

## 2020-05-16 LAB — VALPROIC ACID LEVEL: Valproic Acid Lvl: 93 ug/mL (ref 50.0–100.0)

## 2020-05-16 MED ORDER — ACETAMINOPHEN 325 MG PO TABS: 650.0000 mg | ORAL_TABLET | ORAL | Status: DC | PRN

## 2020-05-16 MED ORDER — LORAZEPAM 0.5 MG PO TABS: 0.5000 mg | ORAL_TABLET | Freq: Three times a day (TID) | ORAL | Status: DC | PRN

## 2020-05-16 MED ORDER — IBUPROFEN 800 MG PO TABS: 800.0000 mg | ORAL_TABLET | Freq: Three times a day (TID) | ORAL | Status: DC | PRN

## 2020-05-16 MED ORDER — DIVALPROEX SODIUM 500 MG PO DR TAB
750.0000 mg | DELAYED_RELEASE_TABLET | Freq: Two times a day (BID) | ORAL | Status: DC
  Administered 2020-05-16 – 2020-05-17 (×3): 750 mg via ORAL
  Filled 2020-05-16 (×3): qty 1

## 2020-05-16 NOTE — ED Notes (Signed)
ONE HOSPITAL BAG WITH CLOTHING INCLUDING WALLET AND CELL PHONE PLACED IN [address].  PT ALLOWED TO GET TELEPHONE NUMBERS OUT OF HIS TELEPHONE.

## 2020-05-16 NOTE — Progress Notes (Signed)
Patient meets criteria for inpatient treatment. No appropriate or available beds at [facility]. CSW faxed referrals to the following facilities for review:  [facility]   [facility]   [facility]   [facility]   [facility]   [facility]   [facility]   [facility]   [facility]    TTS will continue to seek bed placement.  Vilma Meckel. Algis Greenhouse, MSW, LCSW Clinical Social Work/Disposition Phone: [phone] Fax: [phone]

## 2020-05-16 NOTE — ED Notes (Signed)
1 PT BELONGING BAG LOCATED IN [address] (CLOTHING WALLET AND PHONE)
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
## 2020-05-16 NOTE — BH Assessment (Signed)
Tele Assessment Note   Patient Name: William Wilkins MRN: [id number] Referring Physician: EDP Location of Patient: [facility] Location of Provider: Behavioral Health TTS Department  William Wilkins is a 49 y.o. male who presented to [facility] under IVC (petitioner is Pt's wife, [name]) due to reported homicidal and suicidal ideation.  Pt lives in [place] with his wife and step-daughter.  He is employed and receives outpatient psychiatric services for treatment of Bipolar I Disorder with Dr. Marlyne Beards.  Author took history from Pt and Pt's wife/IVC petitioner.  Pt's wife stated that on May 14, 2020, she confessed to Pt that she was/is having an affair with the young adult pastor at their church.  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  Per wife, Pt became enraged, took out one of four firearms that he owns, and sat holding it while she asked him if he was going to kill her or himself.  She stated that when he did not respond, she took that to mean that he was going to kill her and himself.  She also reported that the police encouraged her to petition for IVC on August 20th, but she delayed because Pt then said he would kill himself if brought to the hospital. Wife said she became increasingly alarmed over Pt's behavior and that he threatened to kill her, the young adult pastor, and himself on 8/21.  He also demanded the return of his firearms (wife secured them).  Consequently, wife petitioned for IVC.  Wife stated that she has been concerned about Pt's behavior for some time -- she said he is not consistent with his IVC medication and also he has frightening manic episodes.  Author also assessed Pt who stated as follows:  He stated that he is upset because he learned on Friday that his wife of eight years is cheating on him.  He denied threatening to harm her, himself, or the affair partner.  Pt described his mood as distraught.  Pt stated that his hope is to discharge from the  hospital and to visit [place] where his father lives.  He said also that he assumed he and wife will separate.  Pt admitted one previous suicide attempt about 16 years ago which he attributed to Bipolar I symptoms.  Pt denied current suicidal ideation, homicidal ideation, hallucination, self-injurious behavior, and substance use concerns.  Per report, Pt's four firearms are now in the possession of his church's lead pastor.  During assessment, Pt presented as alert and oriented.  He had good eye contact and was cooperative.  Pt was dressed in scrubs, and he appeared appropriately groomed.  Pt's demeanor was calm and tearful.  Pt's mood was apprehensive, sad, and preoccupied.  Affect was mood congruent.  Pt's speech was normal in rate, rhythm, and volume.  Thought processes were within normal range, and thought content was logical and goal-oriented.  There was no evidence of delusion.  Pt's memory and concentration were intact.  Insight, judgment, and impulse control were fair.  Consulted with Roselie Skinner, NP, who determined that Pt meets inpatient criteria.  Diagnosis: Bipolar I  Past Medical History:  Past Medical History:  Diagnosis Date  . Anxiety   . Bipolar 1 disorder (HCC)     History reviewed. No pertinent surgical history.  Family History:  Family History  Problem Relation Age of Onset  . Diabetes Mother   . Hypertension Mother     Social History:  reports that he has  never smoked. He has never used smokeless tobacco. He reports that he does not drink alcohol and does not use drugs.  Additional Social History:  Alcohol / Drug Use Pain Medications: See MAR Prescriptions: See MAR Over the Counter: See MAR History of alcohol / drug use?: No history of alcohol / drug abuse  CIWA: CIWA-Ar BP: (!) 160/125 Pulse Rate: 83 COWS:    Allergies:  Allergies  Allergen Reactions  . Other Hives    cats    Home Medications: (Not in a hospital admission)   OB/GYN Status:  No LMP  for male patient.  General Assessment Data Location of Assessment: [facility] TTS Assessment: In system Is this a Tele or Face-to-Face Assessment?: Tele Assessment Is this an Initial Assessment or a Re-assessment for this encounter?: Initial Assessment Patient Accompanied by:: N/A Language Other than English: No Living Arrangements: Other (Comment) What gender do you identify as?: Male Date Telepsych consult ordered in [facility]: 05/16/20 Marital status: Married Pregnancy Status: No Living Arrangements: Spouse/significant other, Children Can pt return to current living arrangement?: Yes Admission Status: Involuntary Petitioner: Family member Is patient capable of signing voluntary admission?: Yes Referral Source: Self/Family/Friend Insurance type:  (Self)     Crisis Care Plan Living Arrangements: Spouse/significant other, Children Name of Psychiatrist:  (Dr. Marlyne Beards) Name of Therapist:  [place] Noe  [place])  Education Status Is patient currently in school?: No Is the patient employed, unemployed or receiving disability?: Employed  Risk to self with the past 6 months Suicidal Ideation: No Has patient been a risk to self within the past 6 months prior to admission? : No Suicidal Intent: No Has patient had any suicidal intent within the past 6 months prior to admission? : No Is patient at risk for suicide?: No (But see notes) Suicidal Plan?: No Has patient had any suicidal plan within the past 6 months prior to admission? : No Access to Means: Yes Specify Access to Suicidal Means: Access to firearms What has been your use of drugs/alcohol within the last 12 months?:  (Denied) Previous Attempts/Gestures: Yes How many times?: 1 Other Self Harm Risks:  (None indicated) Triggers for Past Attempts: Unknown Intentional Self Injurious Behavior: None Family Suicide History: Unknown Recent stressful life event(s): Other (Comment) (Learned that wife cheated on him) Persecutory voices/beliefs?:  No Depression: Yes Depression Symptoms: Despondent, Insomnia, Tearfulness, Feeling worthless/self pity, Feeling angry/irritable Substance abuse history and/or treatment for substance abuse?: No Suicide prevention information given to non-admitted patients: Not applicable  Risk to Others within the past 6 months Homicidal Ideation: No (Pt denied, see notes) Does patient have any lifetime risk of violence toward others beyond the six months prior to admission? : Unknown Thoughts of Harm to Others: No (Pt denied see notes) Current Homicidal Intent: No Current Homicidal Plan: No Access to Homicidal Means: No History of harm to others?: No Assessment of Violence: None Noted Does patient have access to weapons?: Yes (Comment) Criminal Charges Pending?:  (Pt owns four guns, currently in possession of his pastor) Does patient have a court date: No Is patient on probation?: No  Psychosis Hallucinations: None noted Delusions: None noted  Mental Status Report Appearance/Hygiene: Unremarkable Eye Contact: Good Motor Activity: Freedom of movement Speech: Logical/coherent Level of Consciousness: Alert Mood: Apprehensive, Ashamed/humiliated, Preoccupied, Sad Affect: Appropriate to circumstance Anxiety Level: None Thought Processes: Coherent, Relevant Judgement: Partial Orientation: Person, Place, Time, Situation Obsessive Compulsive Thoughts/Behaviors: None  Cognitive Functioning Concentration: Normal Memory: Recent Intact, Remote Intact Is patient IDD: No Insight: Fair Impulse  Control: Fair Appetite: Good Have you had any weight changes? : No Change Sleep: Decreased Total Hours of Sleep:  (2) Vegetative Symptoms: None  ADLScreening [facility] Assessment Services) Patient's cognitive ability adequate to safely complete daily activities?: Yes Patient able to express need for assistance with ADLs?: Yes Independently performs ADLs?: Yes (appropriate for developmental age)  Prior  Inpatient Therapy Prior Inpatient Therapy: Yes Prior Therapy Dates:  (2007) Reason for Treatment:  (Suicide attempt)  Prior Outpatient Therapy Prior Outpatient Therapy: Yes Prior Therapy Dates:  (Ongoing) Prior Therapy Facilty/Provider(s):  (Dr. Marlyne Beards) Reason for Treatment:  (Bipolar I) Does patient have an ACCT team?: No Does patient have Intensive In-House Services?  : No Does patient have [facility] services? : No Does patient have P4CC services?: No  ADL Screening (condition at time of admission) Patient's cognitive ability adequate to safely complete daily activities?: Yes Is the patient deaf or have difficulty hearing?: No Does the patient have difficulty seeing, even when wearing glasses/contacts?: No Does the patient have difficulty concentrating, remembering, or making decisions?: No Patient able to express need for assistance with ADLs?: Yes Does the patient have difficulty dressing or bathing?: No Independently performs ADLs?: Yes (appropriate for developmental age) Does the patient have difficulty walking or climbing stairs?: No Weakness of Legs: None Weakness of Arms/Hands: None  Home Assistive Devices/Equipment Home Assistive Devices/Equipment: None  Therapy Consults (therapy consults require a physician order) PT Evaluation Needed: No OT Evalulation Needed: No SLP Evaluation Needed: No Abuse/Neglect Assessment (Assessment to be complete while patient is alone) Abuse/Neglect Assessment Can Be Completed: Yes Physical Abuse: Denies Verbal Abuse: Denies Sexual Abuse: Denies Exploitation of patient/patient's resources: Denies Self-Neglect: Denies Values / Beliefs Cultural Requests During Hospitalization: None Spiritual Requests During Hospitalization: None Consults Spiritual Care Consult Needed: No Transition of Care Team Consult Needed: No Advance Directives (For Healthcare) Does Patient Have a Medical Advance Directive?: No          Disposition:   Disposition Initial Assessment Completed for this Encounter: Yes  This service was provided via telemedicine using a 2-way, interactive audio and video technology.  Names of all persons participating in this telemedicine service and their role in this encounter. Name: Glenford Bayley Role: Patient  Name: Reche Dixon Role: Pt's wife/IVC petitioner          Earline Mayotte 05/16/2020 12:55 PM

## 2020-05-16 NOTE — ED Notes (Signed)
Pt wanded by security. 

## 2020-05-16 NOTE — ED Notes (Addendum)
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 William Wilkins remains calm and denies anger but admits to being greatly disappointed in his wife's behavior. He feels she used the police to lock him up while she removed all the contents of their home and bank accounts. Pt currently denies thoughts of HI/SI and denies AV hallucination.

## 2020-05-16 NOTE — ED Notes (Addendum)
Pt calm and cooperative with nursing staff and procedures. Denies SI/HI/AVH. Slightly tearful when talking about his wife cheating on him with another woman from church, but denies any intention of hurting her or anyone else.   Reports that he is compliant taking Depakote and seeing his therapist Bi-weekly for BiPolar.

## 2020-05-16 NOTE — ED Triage Notes (Signed)
Pt brought in by [facility] under IVC papers that state:  "male respondent, diagnosed with bi-polar, depression and extreme anxiety on medication. Respondent is not sleeping nor eating normally. As of past few days, respondent behaviors have been extreme. He and his wife are going through a divorce and he is threatening to become violent. Wife advised that he stated of a loaded gun on/under pillow. Law enforcement was called to home on yesterday to de-escalate circumstances. Guns were temporarily removed from home due to respondent threatening to harm himself and/or other (including wife). He states he would kill himself if officers committed him. She advised that he threatened her to not come back home on today. He also threatened to harm their pastor whom is withholding huns. Wife is afraid that once guns are back in his possession, he will harm himself or others due to his threats. He does have a history of being committed under similar circumstances".  Pt denies being SI or wanting to hurt anyone. Reports that he recently found out about his wife having an affair with another male pastor in their church.

## 2020-05-16 NOTE — ED Notes (Signed)
ED Provider at bedside. 

## 2020-05-16 NOTE — ED Notes (Signed)
Pt became angry when he found out that he will have to pay for this visit. He tries to stay in control but is so upset about this perceived injustice.

## 2020-05-16 NOTE — ED Notes (Signed)
EKG would not print. It showed up in the chart electronically and Jodi Geralds PA-C informed through messaging.

## 2020-05-17 ENCOUNTER — Encounter: Payer: Self-pay | Admitting: Psychiatry

## 2020-05-17 ENCOUNTER — Inpatient Hospital Stay
Admission: AD | Admit: 2020-05-17 | Discharge: 2020-05-19 | DRG: 885 | Disposition: A | Discharge status: Home / Self Care | Payer: BLUE CROSS/BLUE SHIELD | Attending: Psychiatry | Admitting: Psychiatry

## 2020-05-17 ENCOUNTER — Other Ambulatory Visit: Payer: Self-pay

## 2020-05-17 DIAGNOSIS — F319 Bipolar disorder, unspecified: Secondary | ICD-10-CM | POA: Diagnosis present

## 2020-05-17 DIAGNOSIS — F3132 Bipolar disorder, current episode depressed, moderate: Secondary | ICD-10-CM

## 2020-05-17 DIAGNOSIS — R45851 Suicidal ideations: Secondary | ICD-10-CM | POA: Diagnosis present

## 2020-05-17 DIAGNOSIS — F430 Acute stress reaction: Secondary | ICD-10-CM | POA: Diagnosis not present

## 2020-05-17 DIAGNOSIS — F3130 Bipolar disorder, current episode depressed, mild or moderate severity, unspecified: Secondary | ICD-10-CM | POA: Diagnosis present

## 2020-05-17 MED ORDER — DIVALPROEX SODIUM 500 MG PO DR TAB
750.0000 mg | DELAYED_RELEASE_TABLET | Freq: Two times a day (BID) | ORAL | Status: DC
  Administered 2020-05-17 – 2020-05-19 (×4): 750 mg via ORAL
  Filled 2020-05-17 (×4): qty 1

## 2020-05-17 MED ORDER — ACETAMINOPHEN 325 MG PO TABS: 650.0000 mg | ORAL_TABLET | Freq: Four times a day (QID) | ORAL | Status: DC | PRN

## 2020-05-17 MED ORDER — MAGNESIUM HYDROXIDE 400 MG/5ML PO SUSP: 30.0000 mL | Freq: Every day | ORAL | Status: DC | PRN

## 2020-05-17 MED ORDER — IBUPROFEN 600 MG PO TABS: 800.0000 mg | ORAL_TABLET | Freq: Three times a day (TID) | ORAL | Status: DC | PRN

## 2020-05-17 MED ORDER — ALUM & MAG HYDROXIDE-SIMETH 200-200-20 MG/5ML PO SUSP: 30.0000 mL | ORAL | Status: DC | PRN

## 2020-05-17 MED ORDER — LORAZEPAM 1 MG PO TABS
0.5000 mg | ORAL_TABLET | Freq: Three times a day (TID) | ORAL | Status: DC | PRN
  Administered 2020-05-17: 0.5 mg via ORAL
  Filled 2020-05-17: qty 1

## 2020-05-17 NOTE — Plan of Care (Signed)
  Problem: Education: Goal: Knowledge of [place] [facility] Education information/materials will improve Outcome: Progressing Goal: Emotional status will improve Outcome: Progressing Goal: Mental status will improve Outcome: Progressing Goal: Verbalization of understanding the information provided will improve Outcome: Progressing   Problem: Activity: Goal: Interest or engagement in activities will improve Outcome: Progressing Goal: Sleeping patterns will improve Outcome: Progressing   

## 2020-05-17 NOTE — Progress Notes (Addendum)
CSW received a call from Hume, RN at [facility] stating the patient has been offered a bed and has been accepted and that the pt can arrive on 8/23 (today) after 8pm.  The pt's accepting doctor is Dr. Toni Amend.  The room number will be 305.  The number for report is [phone].  CSW will update RN and EDP.  5:46 PM CSW called Sheriff[employer] Dept to request IVC transport but was unable to reach anyone and left a message asking for a return call.  RN/EDP/CN updated  CSW will continue to follow for D/C needs.  Dorothe Pea. Benjy Kana  MSW, LCSW, LCAS, CCS Transitions of Care Clinical Social Worker Care Coordination Department Ph: [phone]

## 2020-05-17 NOTE — Consult Note (Signed)
  Patient is seen and examined.  Patient is a 49 year old male with a reported past psychiatric history significant for bipolar disorder.  He had been placed under involuntary commitment from the patient's wife.  The patient had recently discovered autografts of his wife in intimate relations with a male Youth worker at a church that he Geneticist, molecular.  This was quite disturbing to him.  The wife apparently had taken weapons out of the home as well as her own possessions.  The patient was quite concerned about this.  The patient is followed by Dr. Marlyne Beards at the [facility] clinic.  He also has a therapist there.  He denied suicidal ideation and stated that he just wanted to go stay with his parents for several days.  I told the patient that we would contact his therapist as well as his parents to assess their comfort with his safety.  Contact with a therapist suggested that the patient would need additional precautions for safety.  There were other issues ongoing with the wife that the patient was unaware of, there was a great deal of concerned that once the patient returned home and found certain items in the home gone that he would become quite distressed.  We will go on it admitted to the hospital.  If necessary we will use involuntary commitment.

## 2020-05-17 NOTE — ED Notes (Signed)
Pt has been cooperative and calm during shift. Pt denied SI/HI. Pt upset and crying at this time bc not leaving. Pt states he is worried about job and wife taking all his money.

## 2020-05-17 NOTE — BH Assessment (Signed)
BHH Assessment Progress Note  Per Landry Mellow, MD this pt requires psychiatric hospitalization at this time.  Pt presents under IVC initiated by pt's wife and upheld by EDP Raeford Razor, MD.  Pt is currently under consideration at [facility].  Final disposition is pending as of this writing.  Doylene Canning, [place] Behavioral [facility] Coordinator [phone]

## 2020-05-18 DIAGNOSIS — F319 Bipolar disorder, unspecified: Secondary | ICD-10-CM

## 2020-05-18 NOTE — BHH Group Notes (Signed)
BHH Group Notes:  (Nursing/MHT/Case Management/Adjunct)  Date:  05/18/2020  Time:  10:13 PM  Type of Therapy:  Group Therapy  Participation Level:  Active  Participation Quality:  Appropriate  Affect:  Appropriate  Cognitive:  Alert  Insight:  Good  Engagement in Group:  Engaged and goal is to go to group.  Modes of Intervention:  Support  Summary of Progress/Problems:  William Wilkins 05/18/2020, 10:13 PM

## 2020-05-18 NOTE — Progress Notes (Signed)
Patient is here under IVC taken out by his wife. 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 He says his stepdaughter found text messages and nude pictures exchanged between the two of them. He says he has guns but never threatened to hurt anyone or himself. Petition says that he had made statements of wanting to hurt himself and possibly his wife and her lover, which patient denies. He has a long history of bipolar disorder and was hospitalized 15 years ago, but he alleges it was not for a suicide attempt or feeling suicidal, but just because he needed help with his bipolar depression which had left him unable to function. He denies SI, HI and AVH. Denies any substance, alcohol or tobacco use. He says that his wife lied to get him into the hospital so that she could clean out his bank account, which he states that she has done. The church he belongs to teaches that homosexuality is a sin so he finds this situation particularly distressing. He lists his supervising pastor, who is also an LPC as one of his supports and also his father. He says his stepdaughter has called her mother a "cheating whore" and does not want to see her again but wants to live with him. He has been followed by Dr. Beverly Milch and has been medication compliant and has had no issues with his illness until this happened. He presents as very pleasant and cooperative. Patient contracts for safety. Skin search done with Susy Frizzle, RN and yielded no contraband. Patient has what appears to be a bruise on his right knee which he says is a callus from playing sports. No history of medical problems, including hypertension, although his bp was elevated upon admission which he attributes to the stress of the situation.

## 2020-05-18 NOTE — H&P (Signed)
Psychiatric Admission Assessment Adult  Patient Identification: William Wilkins MRN:  [id number] Date of Evaluation:  05/18/2020 Chief Complaint:  Bipolar depression (HCC) [F31.9] Principal Diagnosis: Bipolar depression (HCC) Diagnosis:  Principal Problem:   Bipolar depression (HCC)  History of Present Illness: Patient seen and chart reviewed.  49 year old man transferred to [place] from [facility] where he was brought in under involuntary commitment papers filed by his wife.  In the paperwork she has alleged that he was threatening to kill her and to kill himself.  Patient has been under a great deal of stress the last few days.  It is agreed that on Friday he learned that his wife was having an affair with another woman and 1 whom they both worked with as well.  Patient admits that obviously this is been emotionally difficult for him.  He denies however that he ever said anything threatening violence towards his wife for threatening suicide.  He admits that he owns guns and has them at home but denies that he had any of them out.  He says that his mood now is anxious and somewhat dysphoric but that he feels no rage and generally feels a desire to be forgiving but to move on with his life.  He is upset mostly with his wife over his claim that she has taken a bunch of their money in the time since he has been in the hospital.  Patient denies any auditory or visual hallucinations.  Says that he was sleeping okay feeling okay and not having any symptoms of depression anxiety or mania leading up to the incidence of Friday.  Does not drink regularly does not use any drugs.  He says he has been compliant with his prescribed Depakote which is used usually his medicine for a history of bipolar disorder.  Patient is requesting discharge stating that he is afraid he will lose his job. Associated Signs/Symptoms: Depression Symptoms:  anxiety, (Hypo) Manic Symptoms:  Denies any Anxiety Symptoms:  Excessive  Worry, Psychotic Symptoms:  Denies any PTSD Symptoms: Negative Total Time spent with patient: 1 hour  Past Psychiatric History: Patient has a diagnosis of bipolar disorder which she says started when he was a late adolescent.  He says he has had 1 previous hospitalization about 13 years ago.  He denies any history of suicidal behavior or violence.  He says the only medicine he has ever been on his Depakote which he takes regularly.  Denies any history of psychotic symptoms.  Is the patient at risk to self? No.  Has the patient been a risk to self in the past 6 months? No.  Has the patient been a risk to self within the distant past? No.  Is the patient a risk to others? No.  Has the patient been a risk to others in the past 6 months? No.  Has the patient been a risk to others within the distant past? No.   Prior Inpatient Therapy:   Prior Outpatient Therapy:    Alcohol Screening: 1. How often do you have a drink containing alcohol?: Never 2. How many drinks containing alcohol do you have on a typical day when you are drinking?: 1 or 2 3. How often do you have six or more drinks on one occasion?: Never AUDIT-C Score: 0 4. How often during the last year have you found that you were not able to stop drinking once you had started?: Never 5. How often during the last year have you failed to do  what was normally expected from you because of drinking?: Never 6. How often during the last year have you needed a first drink in the morning to get yourself going after a heavy drinking session?: Never 7. How often during the last year have you had a feeling of guilt of remorse after drinking?: Never 8. How often during the last year have you been unable to remember what happened the night before because you had been drinking?: Never 9. Have you or someone else been injured as a result of your drinking?: No 10. Has a relative or friend or a doctor or another health worker been concerned about your  drinking or suggested you cut down?: No Alcohol Use Disorder Identification Test Final Score (AUDIT): 0 Alcohol Brief Interventions/Follow-up: Brief Advice Substance Abuse History in the last 12 months:  No. Consequences of Substance Abuse: Negative Previous Psychotropic Medications: Yes  Psychological Evaluations: Yes  Past Medical History:  Past Medical History:  Diagnosis Date  . Anxiety   . Bipolar 1 disorder (HCC)    History reviewed. No pertinent surgical history. Family History:  Family History  Problem Relation Age of Onset  . Diabetes Mother   . Hypertension Mother    Family Psychiatric  History: Patient denies being aware of any mental health history in his family and denies any known history of family suicide Tobacco Screening: Have you used any form of tobacco in the last 30 days? (Cigarettes, Smokeless Tobacco, Cigars, and/or Pipes): No Social History:  Social History   Substance and Sexual Activity  Alcohol Use No     Social History   Substance and Sexual Activity  Drug Use No    Additional Social History: Marital status: Married Number of Years Married: 6 What types of issues is patient dealing with in the relationship?: Pt reports recent infidenlity from his wife. Does patient have children?: Yes How many children?: 2 How is patient's relationship with their children?: Pt reports that he has a 37 year old and a 47 year old.  He reports relationship is "great".                         Allergies:   Allergies  Allergen Reactions  . Other Hives    cats   Lab Results: No results found for this or any previous visit (from the past 48 hour(s)).  Blood Alcohol level:  Lab Results  Component Value Date   ETH <10 05/16/2020    Metabolic Disorder Labs:  No results found for: HGBA1C, MPG No results found for: PROLACTIN No results found for: CHOL, TRIG, HDL, CHOLHDL, VLDL, LDLCALC  Current Medications: Current Facility-Administered  Medications  Medication Dose Route Frequency Provider Last Rate Last Admin  . acetaminophen (TYLENOL) tablet 650 mg  650 mg Oral Q6H PRN Wilson Dusenbery T, MD      . alum & mag hydroxide-simeth (MAALOX/MYLANTA) 200-200-20 MG/5ML suspension 30 mL  30 mL Oral Q4H PRN Tosha Belgarde T, MD      . divalproex (DEPAKOTE) DR tablet 750 mg  750 mg Oral BID Yahmir Sokolov, Jackquline Denmark, MD   750 mg at 05/18/20 0539  . ibuprofen (ADVIL) tablet 800 mg  800 mg Oral TID PRN Harriette Tovey, Jackquline Denmark, MD      . LORazepam (ATIVAN) tablet 0.5 mg  0.5 mg Oral TID PRN Shloima Clinch, Jackquline Denmark, MD   0.5 mg at 05/17/20 2208  . magnesium hydroxide (MILK OF MAGNESIA) suspension 30 mL  30 mL  Oral Daily PRN Oumou Smead, Jackquline DenmarkJohn T, MD       PTA Medications: Medications Prior to Admission  Medication Sig Dispense Refill Last Dose  . cephALEXin (KEFLEX) 500 MG capsule Take 1 capsule (500 mg total) by mouth 3 (three) times daily. (Patient not taking: Reported on 05/16/2020) 30 capsule 0   . cyclobenzaprine (FLEXERIL) 5 MG tablet Take 1 tablet (5 mg total) by mouth 2 (two) times daily as needed for muscle spasms. (Patient not taking: Reported on 05/16/2020) 14 tablet 0   . divalproex (DEPAKOTE) 250 MG DR tablet Take 3 tablets (750 mg total) by mouth 2 (two) times daily. 180 tablet 11   . ibuprofen (ADVIL,MOTRIN) 200 MG tablet Take 800 mg by mouth 3 (three) times daily as needed. For pain     . lidocaine (LIDODERM) 5 % Place 1 patch onto the skin daily. Remove & Discard patch within 12 hours or as directed by MD (Patient not taking: Reported on 05/16/2020) 30 patch 0   . LORazepam (ATIVAN) 0.5 MG tablet Take 0.5 mg by mouth 3 (three) times daily as needed. For anxiety     . oxyCODONE-acetaminophen (PERCOCET/ROXICET) 5-325 MG tablet Take 1 tablet by mouth every 6 (six) hours as needed for severe pain. (Patient not taking: Reported on 05/16/2020) 10 tablet 0     Musculoskeletal: Strength & Muscle Tone: within normal limits Gait & Station: normal Patient leans:  N/A  Psychiatric Specialty Exam: Physical Exam Vitals and nursing note reviewed.  Constitutional:      Appearance: He is well-developed.  HENT:     Head: Normocephalic and atraumatic.  Eyes:     Conjunctiva/sclera: Conjunctivae normal.     Pupils: Pupils are equal, round, and reactive to light.  Cardiovascular:     Heart sounds: Normal heart sounds.  Pulmonary:     Effort: Pulmonary effort is normal.  Abdominal:     Palpations: Abdomen is soft.  Musculoskeletal:        General: Normal range of motion.     Cervical back: Normal range of motion.  Skin:    General: Skin is warm and dry.  Neurological:     General: No focal deficit present.     Mental Status: He is alert.  Psychiatric:        Attention and Perception: Attention normal.        Mood and Affect: Mood normal.        Speech: Speech normal.        Behavior: Behavior normal.        Thought Content: Thought content normal.        Cognition and Memory: Cognition normal.        Judgment: Judgment normal.     Review of Systems  Constitutional: Negative.   HENT: Negative.   Eyes: Negative.   Respiratory: Negative.   Cardiovascular: Negative.   Gastrointestinal: Negative.   Musculoskeletal: Negative.   Skin: Negative.   Neurological: Negative.   Psychiatric/Behavioral: Negative.     Blood pressure (!) 175/109, pulse 86, temperature 99.5 F (37.5 C), temperature source Oral, resp. rate 16, height 5\' 9"  (1.753 m), weight 115.7 kg, SpO2 97 %.Body mass index is 37.66 kg/m.  General Appearance: Casual  Eye Contact:  Fair  Speech:  Clear and Coherent  Volume:  Normal  Mood:  Euthymic  Affect:  Congruent  Thought Process:  Goal Directed  Orientation:  Full (Time, Place, and Person)  Thought Content:  Logical  Suicidal Thoughts:  No  Homicidal Thoughts:  No  Memory:  Immediate;   Fair Recent;   Fair Remote;   Fair  Judgement:  Fair  Insight:  Fair  Psychomotor Activity:  Normal  Concentration:   Concentration: Fair  Recall:  [employer] of Knowledge:  Fair  Language:  Fair  Akathisia:  No  Handed:  Right  AIMS (if indicated):     Assets:  Desire for Improvement Housing Physical Health Resilience Social Support  ADL's:  Intact  Cognition:  WNL  Sleep:  Number of Hours: 6    Treatment Plan Summary: Daily contact with patient to assess and evaluate symptoms and progress in treatment, Medication management and Plan After a long talk with the patient and review of the notes in the chart it sounds like he has been consistent in his history of denying any suicidal or homicidal thought.  It sounds like the facts are not in doubt and that it is his wife who has cheated on him.  Patient appears to be consistent in his affect and his statements about getting back to work and appropriately managing his life moving on into the future.  There is no sign of psychosis.  He is not pressured in his speech not hyperactive does not appear to be manic either.  Not abusing alcohol and drugs.  At this point the patient seems to be unlikely to meet commitment criteria or to need further hospitalization.  His Depakote level was in the normal range the rest of the labs are unremarkable.  We will make sure he is doing okay tonight and reassess overnight and then most likely he will be able to be discharged tomorrow.  Observation Level/Precautions:  15 minute checks  Laboratory:  Chemistry Profile  Psychotherapy:    Medications:    Consultations:    Discharge Concerns:    Estimated LOS:  Other:     Physician Treatment Plan for Primary Diagnosis: Bipolar depression (HCC) Long Term Goal(s): Improvement in symptoms so as ready for discharge  Short Term Goals: Ability to verbalize feelings will improve, Ability to demonstrate self-control will improve and Ability to identify and develop effective coping behaviors will improve  Physician Treatment Plan for Secondary Diagnosis: Principal Problem:   Bipolar  depression (HCC)  Long Term Goal(s): Improvement in symptoms so as ready for discharge  Short Term Goals: Compliance with prescribed medications will improve  I certify that inpatient services furnished can reasonably be expected to improve the patient's condition.    Mordecai Rasmussen, MD 8/24/20212:32 PM

## 2020-05-18 NOTE — [facility] Suicide Risk Assessment (Signed)
[facility] INPATIENT:  Family/Significant Other Suicide Prevention Education  Suicide Prevention Education:  Patient Refusal for Family/Significant Other Suicide Prevention Education: The patient William Wilkins has refused to provide written consent for family/significant other to be provided Family/Significant Other Suicide Prevention Education during admission and/or prior to discharge.  Physician notified.  SPE completed with pt, as pt refused to consent to family contact. SPI pamphlet provided to pt and pt was encouraged to share information with support network, ask questions, and talk about any concerns relating to SPE. Pt denies access to guns/firearms and verbalized understanding of information provided. Mobile Crisis information also provided to pt.    Harden Mo 05/18/2020, 11:57 AM

## 2020-05-18 NOTE — Progress Notes (Addendum)
D- Patient alert and oriented. Affect/mood is wide eyed, animated, somewhat cooperative, Pt denies SI, HI, AVH, and pain. Pt states that he feels better than he did this morning. Pt attended groups and has been social. Pt is pleasant.   A- Scheduled medications administered to patient, per MD orders. Support and encouragement provided.  Routine safety checks conducted every 15 minutes.  Patient informed to notify staff with problems or concerns.  R- No adverse drug reactions noted. Patient contracts for safety at this time. Patient compliant with medications and treatment plan. Patient receptive, calm, and cooperative. Patient interacts well with others on the unit.  Patient remains safe at this time.  Torrie Mayers RN

## 2020-05-18 NOTE — Tx Team (Addendum)
Interdisciplinary Treatment and Diagnostic Plan Update  05/18/2020 Time of Session: 9:00AM  William Wilkins MRN: [id number]  Principal Diagnosis: <principal problem not specified>  Secondary Diagnoses: Active Problems:   Bipolar depression ([place])   Current Medications:  Current Facility-Administered Medications  Medication Dose Route Frequency Provider Last Rate Last Admin  . acetaminophen (TYLENOL) tablet 650 mg  650 mg Oral Q6H PRN Clapacs, John T, MD      . alum & mag hydroxide-simeth (MAALOX/MYLANTA) 200-200-20 MG/5ML suspension 30 mL  30 mL Oral Q4H PRN Clapacs, John T, MD      . divalproex (DEPAKOTE) DR tablet 750 mg  750 mg Oral BID Clapacs, Madie Reno, MD   750 mg at 05/18/20 6945  . ibuprofen (ADVIL) tablet 800 mg  800 mg Oral TID PRN Clapacs, Madie Reno, MD      . LORazepam (ATIVAN) tablet 0.5 mg  0.5 mg Oral TID PRN Clapacs, Madie Reno, MD   0.5 mg at 05/17/20 2208  . magnesium hydroxide (MILK OF MAGNESIA) suspension 30 mL  30 mL Oral Daily PRN Clapacs, Madie Reno, MD       PTA Medications: Medications Prior to Admission  Medication Sig Dispense Refill Last Dose  . cephALEXin (KEFLEX) 500 MG capsule Take 1 capsule (500 mg total) by mouth 3 (three) times daily. (Patient not taking: Reported on 05/16/2020) 30 capsule 0   . cyclobenzaprine (FLEXERIL) 5 MG tablet Take 1 tablet (5 mg total) by mouth 2 (two) times daily as needed for muscle spasms. (Patient not taking: Reported on 05/16/2020) 14 tablet 0   . divalproex (DEPAKOTE) 250 MG DR tablet Take 3 tablets (750 mg total) by mouth 2 (two) times daily. 180 tablet 11   . ibuprofen (ADVIL,MOTRIN) 200 MG tablet Take 800 mg by mouth 3 (three) times daily as needed. For pain     . lidocaine (LIDODERM) 5 % Place 1 patch onto the skin daily. Remove & Discard patch within 12 hours or as directed by MD (Patient not taking: Reported on 05/16/2020) 30 patch 0   . LORazepam (ATIVAN) 0.5 MG tablet Take 0.5 mg by mouth 3 (three) times daily as needed. For anxiety      . oxyCODONE-acetaminophen (PERCOCET/ROXICET) 5-325 MG tablet Take 1 tablet by mouth every 6 (six) hours as needed for severe pain. (Patient not taking: Reported on 05/16/2020) 10 tablet 0     Patient Stressors: Marital or family conflict  Patient Strengths: Ability for insight Active sense of humor Average or above average intelligence Capable of independent living Communication skills  Treatment Modalities: Medication Management, Group therapy, Case management,  1 to 1 session with clinician, Psychoeducation, Recreational therapy.   Physician Treatment Plan for Primary Diagnosis: <principal problem not specified> Long Term Goal(s):     Short Term Goals:    Medication Management: Evaluate patient's response, side effects, and tolerance of medication regimen.  Therapeutic Interventions: 1 to 1 sessions, Unit Group sessions and Medication administration.  Evaluation of Outcomes: Not Met  Physician Treatment Plan for Secondary Diagnosis: Active Problems:   Bipolar depression ([place])  Long Term Goal(s):     Short Term Goals:       Medication Management: Evaluate patient's response, side effects, and tolerance of medication regimen.  Therapeutic Interventions: 1 to 1 sessions, Unit Group sessions and Medication administration.  Evaluation of Outcomes: Not Met   RN Treatment Plan for Primary Diagnosis: <principal problem not specified> Long Term Goal(s): Knowledge of disease and therapeutic regimen to maintain health will  improve  Short Term Goals: Ability to verbalize frustration and anger appropriately will improve, Ability to demonstrate self-control, Ability to participate in decision making will improve, Ability to verbalize feelings will improve and Ability to identify and develop effective coping behaviors will improve  Medication Management: RN will administer medications as ordered by provider, will assess and evaluate patient's response and provide education to  patient for prescribed medication. RN will report any adverse and/or side effects to prescribing provider.  Therapeutic Interventions: 1 on 1 counseling sessions, Psychoeducation, Medication administration, Evaluate responses to treatment, Monitor vital signs and CBGs as ordered, Perform/monitor CIWA, COWS, AIMS and Fall Risk screenings as ordered, Perform wound care treatments as ordered.  Evaluation of Outcomes: Not Met   LCSW Treatment Plan for Primary Diagnosis: <principal problem not specified> Long Term Goal(s): Safe transition to appropriate next level of care at discharge, Engage patient in therapeutic group addressing interpersonal concerns.  Short Term Goals: Engage patient in aftercare planning with referrals and resources, Increase social support, Increase ability to appropriately verbalize feelings, Increase emotional regulation, Facilitate acceptance of mental health diagnosis and concerns and Increase skills for wellness and recovery  Therapeutic Interventions: Assess for all discharge needs, 1 to 1 time with Social worker, Explore available resources and support systems, Assess for adequacy in community support network, Educate family and significant other(s) on suicide prevention, Complete Psychosocial Assessment, Interpersonal group therapy.  Evaluation of Outcomes: Not Met   Progress in Treatment: Attending groups: Yes. Participating in groups: Yes. Taking medication as prescribed: Yes. Toleration medication: Yes. Family/Significant other contact made: No, will contact:  pt declined SPE contact with collateral.  SPE completed with the patient.  Patient understands diagnosis: Yes. Discussing patient identified problems/goals with staff: Yes. Medical problems stabilized or resolved: Yes. Denies suicidal/homicidal ideation: Yes. Issues/concerns per patient self-inventory: No. Other: none  New problem(s) identified: No, Describe:  none  New Short Term/Long Term Goal(s):  elimination of symptoms of psychosis, medication management for mood stabilization; elimination of SI thoughts; development of comprehensive mental wellness/sobriety plan.  Patient Goals:  "pick up the pieces that my wife shattered"  Discharge Plan or Barriers:  Patient reports plans to continue with his current providers. He reports plans to return to his home.  Reason for Continuation of Hospitalization: Aggression Anxiety Depression Homicidal ideation Medication stabilization Suicidal ideation  Estimated Length of Stay:  1-7 days  Recreational Therapy: Patient Stressors: N/A Patient Goal: Patient will engage in groups without prompting or encouragement from LRT x3 group sessions within 5 recreation therapy group sessions.  Attendees: Patient: William Wilkins 05/18/2020 1:10 PM  Physician: Dr. Weber Cooks, MD 05/18/2020 1:10 PM  Nursing: Dr. Weber Cooks, MD 05/18/2020 1:10 PM  RN Care Manager: 05/18/2020 1:10 PM  Social Worker: Assunta Curtis, LCSW 05/18/2020 1:10 PM  Recreational Therapist: Devin Going, LRT 05/18/2020 1:10 PM  Other:  05/18/2020 1:10 PM  Other:  05/18/2020 1:10 PM  Other: 05/18/2020 1:10 PM    Scribe for Treatment Team: Rozann Lesches, LCSW 05/18/2020 1:10 PM

## 2020-05-18 NOTE — Plan of Care (Signed)
Pt denies depression, anxiety, SI, HI and AVH. Pt was educated on care plan and verbalizes understanding. Pt was encouraged to attend groups. Torrie Mayers RN Problem: Education: Goal: Knowledge of [place] [facility] Education information/materials will improve Outcome: Progressing Goal: Emotional status will improve Outcome: Progressing Goal: Mental status will improve Outcome: Progressing Goal: Verbalization of understanding the information provided will improve Outcome: Progressing   Problem: Activity: Goal: Interest or engagement in activities will improve Outcome: Progressing Goal: Sleeping patterns will improve Outcome: Progressing   Problem: Coping: Goal: Ability to verbalize frustrations and anger appropriately will improve Outcome: Progressing Goal: Ability to demonstrate self-control will improve Outcome: Progressing   Problem: Health Behavior/Discharge Planning: Goal: Identification of resources available to assist in meeting health care needs will improve Outcome: Progressing Goal: Compliance with treatment plan for underlying cause of condition will improve Outcome: Progressing   Problem: Physical Regulation: Goal: Ability to maintain clinical measurements within normal limits will improve Outcome: Progressing   Problem: Safety: Goal: Periods of time without injury will increase Outcome: Progressing   Problem: Education: Goal: Utilization of techniques to improve thought processes will improve Outcome: Progressing Goal: Knowledge of the prescribed therapeutic regimen will improve Outcome: Progressing   Problem: Activity: Goal: Interest or engagement in leisure activities will improve Outcome: Progressing Goal: Imbalance in normal sleep/wake cycle will improve Outcome: Progressing   Problem: Coping: Goal: Coping ability will improve Outcome: Progressing Goal: Will verbalize feelings Outcome: Progressing

## 2020-05-18 NOTE — BHH Suicide Risk Assessment (Signed)
[facility] Admission Suicide Risk Assessment   Nursing information obtained from:  Patient Demographic factors:  Male, Caucasian, Access to firearms, Divorced or widowed Current Mental Status:  Suicidal ideation indicated by others Loss Factors:  Loss of significant relationship, Financial problems / change in socioeconomic status Historical Factors:  Family history of mental illness or substance abuse Risk Reduction Factors:  Responsible for children under 46 years of age, Religious beliefs about death  Total Time spent with patient: 1 hour Principal Problem: Bipolar depression ([place]) Diagnosis:  Principal Problem:   Bipolar depression ([place])  Subjective Data: Patient seen chart reviewed.  49 year old man with a past diagnosis of bipolar disorder who was petitioned by his wife on the grounds that she claims that he was threatening her and threatening to kill himself.  Patient is cooperative with the interview and appears to be forthcoming.  His story has been consistent.  He denies any suicidal thoughts intent or plan and denies any threats to hurt himself or anyone else.  It is clear that he has been under a very stressful situation the last few days.  Currently presents with a appropriate plan without any talk about dangerousness for the future.  Continued Clinical Symptoms:  Alcohol Use Disorder Identification Test Final Score (AUDIT): 0 The "Alcohol Use Disorders Identification Test", Guidelines for Use in Primary Care, Second Edition.  World Pharmacologist [facility]). Score between 0-7:  no or low risk or alcohol related problems. Score between 8-15:  moderate risk of alcohol related problems. Score between 16-19:  high risk of alcohol related problems. Score 20 or above:  warrants further diagnostic evaluation for alcohol dependence and treatment.   CLINICAL FACTORS:   Bipolar Disorder:   Mixed State   Musculoskeletal: Strength & Muscle Tone: within normal limits Gait & Station:  normal Patient leans: N/A  Psychiatric Specialty Exam: Physical Exam Constitutional:      Appearance: He is well-developed.  HENT:     Head: Normocephalic and atraumatic.  Eyes:     Conjunctiva/sclera: Conjunctivae normal.     Pupils: Pupils are equal, round, and reactive to light.  Cardiovascular:     Heart sounds: Normal heart sounds.  Pulmonary:     Effort: Pulmonary effort is normal.  Abdominal:     Palpations: Abdomen is soft.  Musculoskeletal:        General: Normal range of motion.     Cervical back: Normal range of motion.  Skin:    General: Skin is warm and dry.  Neurological:     General: No focal deficit present.     Mental Status: He is alert.  Psychiatric:        Attention and Perception: Attention normal.        Mood and Affect: Mood normal.        Speech: Speech normal.        Behavior: Behavior normal. Behavior is cooperative.        Thought Content: Thought content normal.        Cognition and Memory: Cognition normal.        Judgment: Judgment normal.     Review of Systems  Constitutional: Negative.   HENT: Negative.   Eyes: Negative.   Respiratory: Negative.   Cardiovascular: Negative.   Gastrointestinal: Negative.   Musculoskeletal: Negative.   Skin: Negative.   Neurological: Negative.   Psychiatric/Behavioral: Positive for dysphoric mood. Negative for agitation, behavioral problems, confusion, decreased concentration, hallucinations, self-injury, sleep disturbance and suicidal ideas. The patient is nervous/anxious. The  patient is not hyperactive.     Blood pressure (!) 175/109, pulse 86, temperature 99.5 F (37.5 C), temperature source Oral, resp. rate 16, height 5' 9"  (1.753 m), weight 115.7 kg, SpO2 97 %.Body mass index is 37.66 kg/m.  General Appearance: Casual  Eye Contact:  Good  Speech:  Clear and Coherent  Volume:  Normal  Mood:  Euthymic  Affect:  Congruent  Thought Process:  Coherent  Orientation:  Full (Time, Place, and Person)   Thought Content:  Logical  Suicidal Thoughts:  No  Homicidal Thoughts:  No  Memory:  Immediate;   Fair Recent;   Fair Remote;   Fair  Judgement:  Fair  Insight:  Fair  Psychomotor Activity:  Normal  Concentration:  Concentration: Fair  Recall:  [employer] of Knowledge:  Fair  Language:  Fair  Akathisia:  No  Handed:  Right  AIMS (if indicated):     Assets:  Desire for Improvement Housing Physical Health Resilience Vocational/Educational  ADL's:  Intact  Cognition:  WNL  Sleep:  Number of Hours: 6      COGNITIVE FEATURES THAT CONTRIBUTE TO RISK:  None    SUICIDE RISK:   Minimal: No identifiable suicidal ideation.  Patients presenting with no risk factors but with morbid ruminations; may be classified as minimal risk based on the severity of the depressive symptoms  PLAN OF CARE: Patient interviewed and chart reviewed.  Patient does not have a past history of suicide.  He has been consistent during this process and denying any suicidal or homicidal ideation.  He appears to be sincere and appropriate in his emotional expression currently.  Patient will be continued on his usual outpatient medicine.  He met with treatment team.  Reassess tomorrow for possible discharge at that time.  I certify that inpatient services furnished can reasonably be expected to improve the patient's condition.   Alethia Berthold, MD 05/18/2020, 2:28 PM

## 2020-05-18 NOTE — Progress Notes (Signed)
Recreation Therapy Notes   Date: 05/18/2020  Time: 9:30 am   Location: Craft room     Behavioral response: N/A   Intervention Topic: Relaxation   Discussion/Intervention: Patient did not attend group.   Clinical Observations/Feedback:  Patient did not attend group.   Shalamar Plourde LRT/CTRS        Shoua Ressler 05/18/2020 11:55 AM 
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
## 2020-05-18 NOTE — Tx Team (Signed)
Initial Treatment Plan 05/18/2020 6:32 AM William Wilkins FTD:[id number]    PATIENT STRESSORS: Marital or family conflict   PATIENT STRENGTHS: Ability for insight Active sense of humor Average or above average intelligence Capable of independent living Communication skills   PATIENT IDENTIFIED PROBLEMS:       depression               DISCHARGE CRITERIA:  Ability to meet basic life and health needs Adequate post-discharge living arrangements Improved stabilization in mood, thinking, and/or behavior Medical problems require only outpatient monitoring Motivation to continue treatment in a less acute level of care Need for constant or close observation no longer present Reduction of life-threatening or endangering symptoms to within safe limits Safe-care adequate arrangements made Verbal commitment to aftercare and medication compliance  PRELIMINARY DISCHARGE PLAN: Outpatient therapy Return to previous living arrangement  PATIENT/FAMILY INVOLVEMENT: This treatment plan has been presented to and reviewed with the patient, William Wilkins, and/or family member.  The patient and family have been given the opportunity to ask questions and make suggestions.  Billy Coast, RN 05/18/2020, 6:32 AM

## 2020-05-19 MED ORDER — DIVALPROEX SODIUM 250 MG PO DR TAB
750.0000 mg | DELAYED_RELEASE_TABLET | Freq: Two times a day (BID) | ORAL | 0 refills | Status: DC
Start: 2020-05-19 — End: 2021-04-18

## 2020-05-19 NOTE — Progress Notes (Signed)
Pt denies SI, HI and AVH. Pt was educated on dc plan and verbalizes understanding. Pt received belongings, dc packet and prescriptions. Deronte Solis RN 

## 2020-05-19 NOTE — BHH Suicide Risk Assessment (Signed)
[facility] Discharge Suicide Risk Assessment   Principal Problem: Bipolar depression [facility]) Discharge Diagnoses: Principal Problem:   Bipolar depression ([facility])   Total Time spent with patient: 30 minutes  Musculoskeletal: Strength & Muscle Tone: within normal limits Gait & Station: normal Patient leans: N/A  Psychiatric Specialty Exam: Review of Systems  Constitutional: Negative.   HENT: Negative.   Eyes: Negative.   Respiratory: Negative.   Cardiovascular: Negative.   Gastrointestinal: Negative.   Musculoskeletal: Negative.   Skin: Negative.   Neurological: Negative.   Psychiatric/Behavioral: Negative.     Blood pressure (!) 137/103, pulse 78, temperature 97.7 F (36.5 C), temperature source Oral, resp. rate 16, height 5\' 9"  (1.753 m), weight 115.7 kg, SpO2 98 %.Body mass index is 37.66 kg/m.  General Appearance: Fairly Groomed  ::  Good  Speech:  Clear and Coherent409  Volume:  Normal  Mood:  Euthymic  Affect:  Congruent  Thought Process:  Goal Directed  Orientation:  Full (Time, Place, and Person)  Thought Content:  Logical  Suicidal Thoughts:  No  Homicidal Thoughts:  No  Memory:  Immediate;   Fair Recent;   Fair Remote;   Fair  Judgement:  Fair  Insight:  Fair  Psychomotor Activity:  Normal  Concentration:  Fair  Recall:  [id number] of Knowledge:Fair  Language: Fair  Akathisia:  No  Handed:  Right  AIMS (if indicated):     Assets:  Desire for Improvement  Sleep:  Number of Hours: 5.15  Cognition: WNL  ADL's:  Intact   Mental Status Per Nursing Assessment::   On Admission:  Suicidal ideation indicated by others  Demographic Factors:  Male  Loss Factors: Loss of significant relationship  Historical Factors: NA  Risk Reduction Factors:   Sense of responsibility to family, Employed, Living with another person, especially a relative, Positive social support, Positive therapeutic relationship and Positive coping skills or problem solving  skills  Continued Clinical Symptoms:  Depression:   Insomnia  Cognitive Features That Contribute To Risk:  None    Suicide Risk:  Minimal: No identifiable suicidal ideation.  Patients presenting with no risk factors but with morbid ruminations; may be classified as minimal risk based on the severity of the depressive symptoms   Follow-up Information    [facility]. Go on 05/24/2020.   Specialty: Behavioral Health Why: Your appointment is schedueled for 9AM, please bring your discharge paperwork.  Thanks! Contact information: [address] [place] [phone]       [facility] Follow up.   Why: Attempts to schedule appointment were made, but unfortunately calls were not returned.  Please follow up after your discharge.  Thanks! Contact information: [facility] [address] [place] , [place]  [place] Phone: [phone]              Plan Of Care/Follow-up recommendations:  Activity:  Activity as tolerated Diet:  Regular diet Other:  Follow-up with outpatient treatment  [phone], MD 05/19/2020, 9:38 AM

## 2020-05-19 NOTE — Plan of Care (Signed)
Problem: Education: Goal: Knowledge of [facility] Education information/materials will improve Outcome: Progressing Goal: Emotional status will improve Outcome: Progressing Goal: Mental status will improve Outcome: Progressing Goal: Verbalization of understanding the information provided will improve Outcome: Progressing   Problem: Activity: Goal: Interest or engagement in activities will improve Outcome: Progressing Goal: Sleeping patterns will improve Outcome: Progressing   Problem: Coping: Goal: Ability to verbalize frustrations and anger appropriately will improve Outcome: Progressing Goal: Ability to demonstrate self-control will improve Outcome: Progressing   Problem: Health Behavior/Discharge Planning: Goal: Identification of resources available to assist in meeting health care needs will improve Outcome: Progressing Goal: Compliance with treatment plan for underlying cause of condition will improve Outcome: Progressing   Problem: Physical Regulation: Goal: Ability to maintain clinical measurements within normal limits will improve Outcome: Progressing   Problem: Safety: Goal: Periods of time without injury will increase Outcome: Progressing   Problem: Education: Goal: Utilization of techniques to improve thought processes will improve Outcome: Progressing Goal: Knowledge of the prescribed therapeutic regimen will improve Outcome: Progressing   Problem: Activity: Goal: Interest or engagement in leisure activities will improve Outcome: Progressing Goal: Imbalance in normal sleep/wake cycle will improve Outcome: Progressing   Problem: Coping: Goal: Coping ability will improve Outcome: Progressing Goal: Will verbalize feelings Outcome: Progressing   Problem: Health Behavior/Discharge Planning: Goal: Ability to make decisions will improve Outcome: Progressing Goal: Compliance with therapeutic regimen will improve Outcome: Progressing    Problem: Role Relationship: Goal: Will demonstrate positive changes in social behaviors and relationships Outcome: Progressing   Problem: Safety: Goal: Ability to disclose and discuss suicidal ideas will improve Outcome: Progressing Goal: Ability to identify and utilize support systems that promote safety will improve Outcome: Progressing   Problem: Self-Concept: Goal: Will verbalize positive feelings about self Outcome: Progressing Goal: Level of anxiety will decrease Outcome: Progressing   Problem: Education: Goal: Knowledge of [place] [facility] Education information/materials will improve Outcome: Progressing Goal: Emotional status will improve Outcome: Progressing Goal: Mental status will improve Outcome: Progressing Goal: Verbalization of understanding the information provided will improve Outcome: Progressing   Problem: Activity: Goal: Interest or engagement in activities will improve Outcome: Progressing Goal: Sleeping patterns will improve Outcome: Progressing   Problem: Coping: Goal: Ability to verbalize frustrations and anger appropriately will improve Outcome: Progressing Goal: Ability to demonstrate self-control will improve Outcome: Progressing   Problem: Health Behavior/Discharge Planning: Goal: Identification of resources available to assist in meeting health care needs will improve Outcome: Progressing Goal: Compliance with treatment plan for underlying cause of condition will improve Outcome: Progressing   Problem: Physical Regulation: Goal: Ability to maintain clinical measurements within normal limits will improve Outcome: Progressing   Problem: Safety: Goal: Periods of time without injury will increase Outcome: Progressing   Problem: Education: Goal: Utilization of techniques to improve thought processes will improve Outcome: Progressing Goal: Knowledge of the prescribed therapeutic regimen will improve Outcome: Progressing   Problem:  Activity: Goal: Interest or engagement in leisure activities will improve Outcome: Progressing Goal: Imbalance in normal sleep/wake cycle will improve Outcome: Progressing   Problem: Coping: Goal: Coping ability will improve Outcome: Progressing Goal: Will verbalize feelings Outcome: Progressing   Problem: Health Behavior/Discharge Planning: Goal: Ability to make decisions will improve Outcome: Progressing Goal: Compliance with therapeutic regimen will improve Outcome: Progressing   Problem: Role Relationship: Goal: Will demonstrate positive changes in social behaviors and relationships Outcome: Progressing   Problem: Safety: Goal: Ability to disclose and discuss suicidal ideas will improve Outcome: Progressing Goal: Ability  to identify and utilize support systems that promote safety will improve Outcome: Progressing   Problem: Self-Concept: Goal: Will verbalize positive feelings about self Outcome: Progressing Goal: Level of anxiety will decrease Outcome: Progressing

## 2020-05-19 NOTE — Progress Notes (Signed)
Patient is alert and oriented X4. His mood is pleasant and he is easy to engage.  He denies SI/HI/AVH/anxiety and pain. He does endorse depression, that's mild and able to cope. Patient has no prescribed orders for QHS and denied needing any meds to aid in sleep. He has ben active on the unit interacting well with others.  He is safe on the unit with 15 minute safety checks and informed to contact staff with any concerns.   Cleo Butler-Nicholson, LPN

## 2020-05-19 NOTE — Discharge Summary (Signed)
Physician Discharge Summary Note  Patient:  William Wilkins is an 49 y.o., male MRN:  [id number] DOB:  12-Dec-1970 Patient phone:  [phone] (home)  Patient address:   [address] [place] [facility] [address],  Total Time spent with patient: 30 minutes  Date of Admission:  05/17/2020 Date of Discharge: 05/19/2020  Reason for Admission: Patient was admitted under petition alleging that he had been threatening suicide and been threatening violence to his wife in the context of extreme marital difficulties  Principal Problem: Bipolar depression [facility]) Discharge Diagnoses: Principal Problem:   Bipolar depression (HCC)   Past Psychiatric History: Patient has a history of bipolar disorder and sees Dr. Marlyne Beards regularly for medication management  Past Medical History:  Past Medical History:  Diagnosis Date  . Anxiety   . Bipolar 1 disorder (HCC)    History reviewed. No pertinent surgical history. Family History:  Family History  Problem Relation Age of Onset  . Diabetes Mother   . Hypertension Mother    Family Psychiatric  History: See previous notes Social History:  Social History   Substance and Sexual Activity  Alcohol Use No     Social History   Substance and Sexual Activity  Drug Use No    Social History   Socioeconomic History  . Marital status: Legally Separated    Spouse name: Foye Clock  . Number of children: 1  . Years of education: Not on file  . Highest education level: Not on file  Occupational History    Employer: [employer]  Tobacco Use  . Smoking status: Never Smoker  . Smokeless tobacco: Never Used  Vaping Use  . Vaping Use: Never used  Substance and Sexual Activity  . Alcohol use: No  . Drug use: No  . Sexual activity: Yes  Other Topics Concern  . Not on file  Social History Narrative   ** Merged History Encounter **    Pt lives in [place] with wife and step-daughter.  Pt receives outpatient psychiatry services for treatment of  Bipolar I Disorder with Dr. Marlyne Beards at [facility].   Social Determinants of Health   Financial Resource Strain:   . Difficulty of Paying Living Expenses: Not on file  Food Insecurity:   . Worried About Programme researcher, broadcasting/film/video in the Last Year: Not on file  . Ran Out of Food in the Last Year: Not on file  Transportation Needs:   . Lack of Transportation (Medical): Not on file  . Lack of Transportation (Non-Medical): Not on file  Physical Activity:   . Days of Exercise per Week: Not on file  . Minutes of Exercise per Session: Not on file  Stress:   . Feeling of Stress : Not on file  Social Connections:   . Frequency of Communication with Friends and Family: Not on file  . Frequency of Social Gatherings with Friends and Family: Not on file  . Attends Religious Services: Not on file  . Active Member of Clubs or Organizations: Not on file  . Attends Banker Meetings: Not on file  . Marital Status: Not on file    Hospital Course: Patient was kept on 15-minute checks.  He did not display any dangerous behavior in the hospital.  No evidence of suicidal behavior or violence.  He was very consistent in his story about his shock and surprise at discovering his wife was having an affair but has consistently denied any suicidal or homicidal thoughts.  To the contrary he is able  to discuss appropriate reactions to the situation.  He has not been aggressive or agitated irritable or moody on the unit.  No evidence of psychosis.  After observing the patient overnight he did not seem to be manic nor psychotic nor obviously depressed.  There did not appear to be any evidence that he was dangerous to himself or others at this time.  He was agreeable to continuing medication and following up with outpatient treatment and was discharged with plan to continue following up with his usual psychiatrist and encouragement to see a therapist.  Physical Findings: AIMS: Facial and Oral Movements Muscles of  Facial Expression: None, normal Lips and Perioral Area: None, normal Jaw: None, normal Tongue: None, normal,Extremity Movements Upper (arms, wrists, hands, fingers): None, normal Lower (legs, knees, ankles, toes): None, normal, Trunk Movements Neck, shoulders, hips: None, normal, Overall Severity Severity of abnormal movements (highest score from questions above): None, normal Incapacitation due to abnormal movements: None, normal Patient's awareness of abnormal movements (rate only patient's report): No Awareness, Dental Status Current problems with teeth and/or dentures?: No Does patient usually wear dentures?: No  CIWA:    COWS:     Musculoskeletal: Strength & Muscle Tone: within normal limits Gait & Station: normal Patient leans: N/A  Psychiatric Specialty Exam: Physical Exam Vitals and nursing note reviewed.  Constitutional:      Appearance: He is well-developed.  HENT:     Head: Normocephalic and atraumatic.  Eyes:     Conjunctiva/sclera: Conjunctivae normal.     Pupils: Pupils are equal, round, and reactive to light.  Cardiovascular:     Heart sounds: Normal heart sounds.  Pulmonary:     Effort: Pulmonary effort is normal.  Abdominal:     Palpations: Abdomen is soft.  Musculoskeletal:        General: Normal range of motion.     Cervical back: Normal range of motion.  Skin:    General: Skin is warm and dry.  Neurological:     General: No focal deficit present.     Mental Status: He is alert.  Psychiatric:        Attention and Perception: Attention normal.        Mood and Affect: Mood normal.        Speech: Speech normal.        Behavior: Behavior normal.        Thought Content: Thought content normal.        Cognition and Memory: Cognition normal.        Judgment: Judgment normal.     Review of Systems  Constitutional: Negative.   HENT: Negative.   Eyes: Negative.   Respiratory: Negative.   Cardiovascular: Negative.   Gastrointestinal: Negative.    Musculoskeletal: Negative.   Skin: Negative.   Neurological: Negative.   Psychiatric/Behavioral: Negative.     Blood pressure (!) 137/103, pulse 78, temperature 97.7 F (36.5 C), temperature source Oral, resp. rate 16, height 5\' 9"  (1.753 m), weight 115.7 kg, SpO2 98 %.Body mass index is 37.66 kg/m.  General Appearance: Casual  Eye Contact:  Good  Speech:  Clear and Coherent  Volume:  Normal  Mood:  Euthymic  Affect:  Congruent  Thought Process:  Goal Directed  Orientation:  Full (Time, Place, and Person)  Thought Content:  Logical  Suicidal Thoughts:  No  Homicidal Thoughts:  No  Memory:  Immediate;   Fair Recent;   Fair Remote;   Fair  Judgement:  Fair  Insight:  Fair  Psychomotor Activity:  Normal  Concentration:  Concentration: Fair  Recall:  [employer] of Knowledge:  Fair  Language:  Fair  Akathisia:  No  Handed:  Right  AIMS (if indicated):     Assets:  Desire for Improvement Housing Physical Health Resilience Social Support Vocational/Educational  ADL's:  Intact  Cognition:  WNL  Sleep:  Number of Hours: 5.15     Have you used any form of tobacco in the last 30 days? (Cigarettes, Smokeless Tobacco, Cigars, and/or Pipes): No  Has this patient used any form of tobacco in the last 30 days? (Cigarettes, Smokeless Tobacco, Cigars, and/or Pipes) Yes, No  Blood Alcohol level:  Lab Results  Component Value Date   ETH <10 05/16/2020    Metabolic Disorder Labs:  No results found for: HGBA1C, MPG No results found for: PROLACTIN No results found for: CHOL, TRIG, HDL, CHOLHDL, VLDL, LDLCALC  See Psychiatric Specialty Exam and Suicide Risk Assessment completed by Attending Physician prior to discharge.  Discharge destination:  Home  Is patient on multiple antipsychotic therapies at discharge:  No   Has Patient had three or more failed trials of antipsychotic monotherapy by history:  No  Recommended Plan for Multiple Antipsychotic Therapies: NA  Discharge  Instructions    Diet - low sodium heart healthy   Complete by: As directed    Increase activity slowly   Complete by: As directed      Allergies as of 05/19/2020      Reactions   Other Hives   cats      Medication List    STOP taking these medications   cephALEXin 500 MG capsule Commonly known as: KEFLEX   cyclobenzaprine 5 MG tablet Commonly known as: FLEXERIL   ibuprofen 200 MG tablet Commonly known as: ADVIL   lidocaine 5 % Commonly known as: Lidoderm   oxyCODONE-acetaminophen 5-325 MG tablet Commonly known as: PERCOCET/ROXICET     TAKE these medications     Indication  divalproex 250 MG DR tablet Commonly known as: DEPAKOTE Take 3 tablets (750 mg total) by mouth 2 (two) times daily.  Indication: Depressive Phase of Manic-Depression   LORazepam 0.5 MG tablet Commonly known as: ATIVAN Take 0.5 mg by mouth 3 (three) times daily as needed. For anxiety  Indication: Feeling Anxious       Follow-up Information    [facility]. Go on 05/24/2020.   Specialty: Behavioral Health Why: Your appointment is schedueled for 9AM, please bring your discharge paperwork.  Thanks! Contact information: [address], [address] [place] [address] [phone]       [facility] Follow up.   Why: You are scheduled for 4pm on 05/20/2020, please call if you need to reschedule the date and time of your appointment.  Thanks! Contact information: [facility] [address] [place] , [place]  [address] Phone: [phone]              Follow-up recommendations:  Activity:  Activity as tolerated Diet:  Regular diet Other:  Continue current mood stabilizer and follow-up with outpatient psychiatric care and follow-up with recommended counseling  Comments: Patient absolutely denied any suicidal or homicidal ideation.  He was consistent about this.  His affect was euthymic with no  signs of anger or depression.  Thoughts appeared to be clear and lucid and consistent.  Prescription given at discharge for his Depakote  Signed: Mordecai Rasmussen, MD 05/19/2020, 2:38 PM

## 2020-05-19 NOTE — Progress Notes (Signed)
  [facility] Adult Case Management Discharge Plan :  Will you be returning to the same living situation after discharge:  Yes,  pt reports that he is returning home.  At discharge, do you have transportation home?: Yes,  CSW will assist pt with transportation.  Do you have the ability to pay for your medications: Yes,  BCBS  Release of information consent forms completed and in the chart;  Patient's signature needed at discharge.  Patient to Follow up at:  Follow-up Information    [facility]. Go on 05/24/2020.   Specialty: Behavioral Health Why: Your appointment is schedueled for 9AM, please bring your discharge paperwork.  Thanks! Contact information: [address], [address] [place] [address] [phone]       [facility] Follow up.   Why: Attempts to schedule appointment were made, but unfortunately calls were not returned.  Please follow up after your discharge.  Thanks! Contact information: [facility] [address] [place] , [place]  [address] Phone: [phone]              Next level of care provider has access to [facility] Link:no  Safety Planning and Suicide Prevention discussed: No.  Pt declined SPE contact with collaterals.  SPE was completed with the patient.   Have you used any form of tobacco in the last 30 days? (Cigarettes, Smokeless Tobacco, Cigars, and/or Pipes): No  Has patient been referred to the Quitline?: Patient refused referral  Patient has been referred for addiction treatment: Pt. refused referral  Harden Mo, LCSW 05/19/2020, 9:16 AM

## 2020-05-19 NOTE — Plan of Care (Signed)
Pt denies depression, anxiety, SI, HI and AVH. Pt was educated on care plan and verbalizes understanding. He anticipates discharge. William Mayers RN Problem: Education: Goal: Knowledge of [facility] Education information/materials will improve Outcome: Adequate for Discharge Goal: Emotional status will improve Outcome: Adequate for Discharge Goal: Mental status will improve Outcome: Adequate for Discharge Goal: Verbalization of understanding the information provided will improve Outcome: Adequate for Discharge   Problem: Activity: Goal: Interest or engagement in activities will improve Outcome: Adequate for Discharge Goal: Sleeping patterns will improve Outcome: Adequate for Discharge   Problem: Coping: Goal: Ability to verbalize frustrations and anger appropriately will improve Outcome: Adequate for Discharge Goal: Ability to demonstrate self-control will improve Outcome: Adequate for Discharge   Problem: Health Behavior/Discharge Planning: Goal: Identification of resources available to assist in meeting health care needs will improve Outcome: Adequate for Discharge Goal: Compliance with treatment plan for underlying cause of condition will improve Outcome: Adequate for Discharge   Problem: Physical Regulation: Goal: Ability to maintain clinical measurements within normal limits will improve Outcome: Adequate for Discharge   Problem: Safety: Goal: Periods of time without injury will increase Outcome: Adequate for Discharge   Problem: Education: Goal: Utilization of techniques to improve thought processes will improve Outcome: Adequate for Discharge Goal: Knowledge of the prescribed therapeutic regimen will improve Outcome: Adequate for Discharge   Problem: Activity: Goal: Interest or engagement in leisure activities will improve Outcome: Adequate for Discharge Goal: Imbalance in normal sleep/wake cycle will improve Outcome: Adequate for Discharge   Problem:  Coping: Goal: Coping ability will improve Outcome: Adequate for Discharge Goal: Will verbalize feelings Outcome: Adequate for Discharge   Problem: Health Behavior/Discharge Planning: Goal: Ability to make decisions will improve Outcome: Adequate for Discharge Goal: Compliance with therapeutic regimen will improve Outcome: Adequate for Discharge   Problem: Role Relationship: Goal: Will demonstrate positive changes in social behaviors and relationships Outcome: Adequate for Discharge   Problem: Safety: Goal: Ability to disclose and discuss suicidal ideas will improve Outcome: Adequate for Discharge Goal: Ability to identify and utilize support systems that promote safety will improve Outcome: Adequate for Discharge   Problem: Self-Concept: Goal: Will verbalize positive feelings about self Outcome: Adequate for Discharge Goal: Level of anxiety will decrease Outcome: Adequate for Discharge

## 2020-05-19 NOTE — Progress Notes (Signed)
Recreation Therapy Notes   Date: 05/19/2020  Time: 9:30 am  Location: Craft room   Behavioral response: Appropriate  Intervention Topic: Problem Solving    Discussion/Intervention:  Group content on today was focused on problem solving. The group described what problem solving is. Patients expressed how problems affect them and how they deal with problems. Individuals identified healthy ways to deal with problems. Patients explained what normally happens to them when they do not deal with problems. The group expressed reoccurring problems for them. The group participated in the intervention "Ways to Solve problems" where patients were given a chance to explore different ways to solve problems.  Clinical Observations/Feedback:  Patient came to group and explained that problem solving gives you an opportunity to stop, breath and think. Individual was social with peers and staff while participating in the intervention. Mahamadou Weltz LRT/CTRS         Jeran Hiltz 05/19/2020 1:45 PM

## 2020-05-24 ENCOUNTER — Ambulatory Visit: Payer: BLUE CROSS/BLUE SHIELD | Admitting: Psychiatry

## 2020-05-24 MED FILL — Divalproex Sodium Tab Delayed Release 500 MG: ORAL | Qty: 500 | Status: AC

## 2020-05-24 MED FILL — Divalproex Sodium Tab Delayed Release 250 MG: ORAL | Qty: 250 | Status: AC

## 2020-07-15 ENCOUNTER — Encounter: Payer: Self-pay | Admitting: Psychiatry

## 2021-02-17 ENCOUNTER — Ambulatory Visit: Payer: BLUE CROSS/BLUE SHIELD | Admitting: Psychiatry

## 2021-02-28 IMAGING — DX DG LUMBAR SPINE COMPLETE 4+V
5 series · 5 of 5 positions shown · non-contrast
Comparison: None.

CLINICAL DATA: Low back pain following heavy lifting, initial
encounter

EXAM:
LUMBAR SPINE - COMPLETE 4+ VIEW

[t lumbar spine obl (1 of 2)]
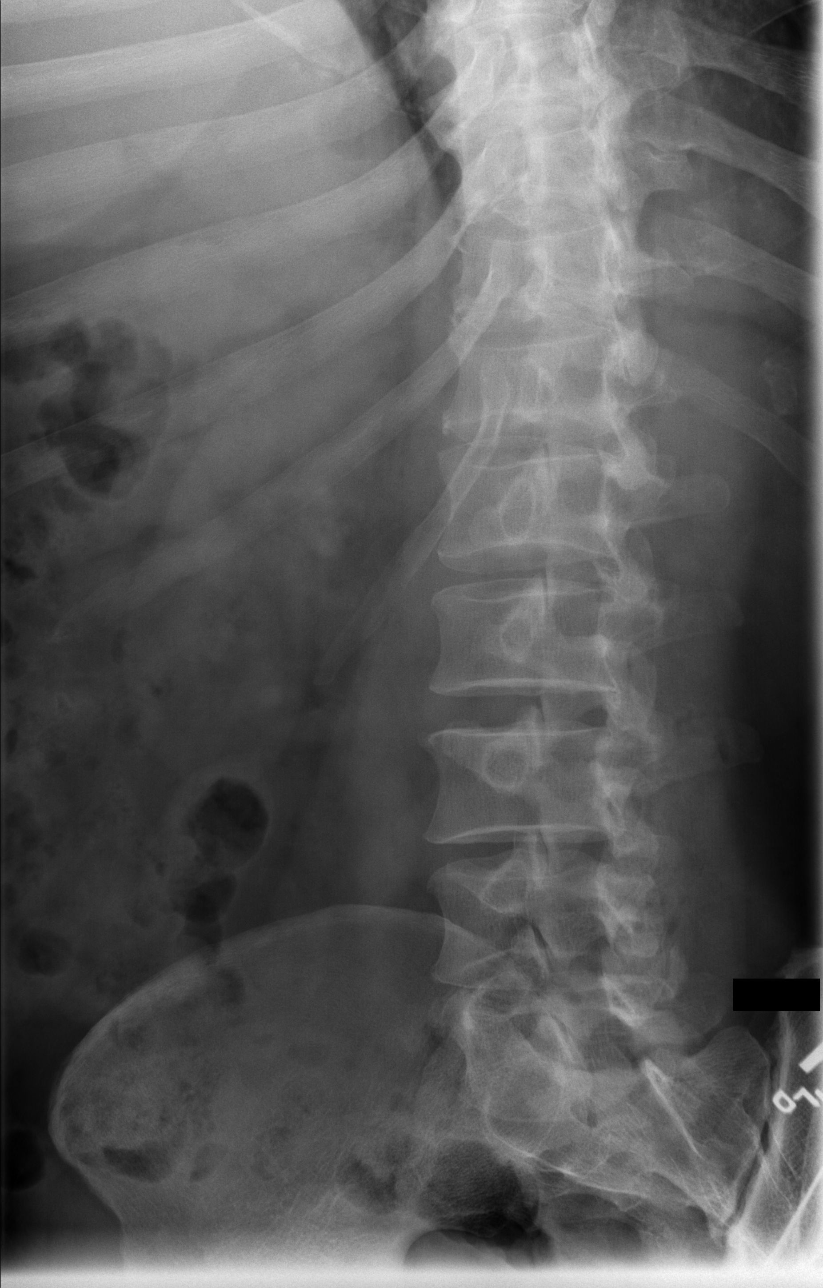

[t lumbar spine ap]
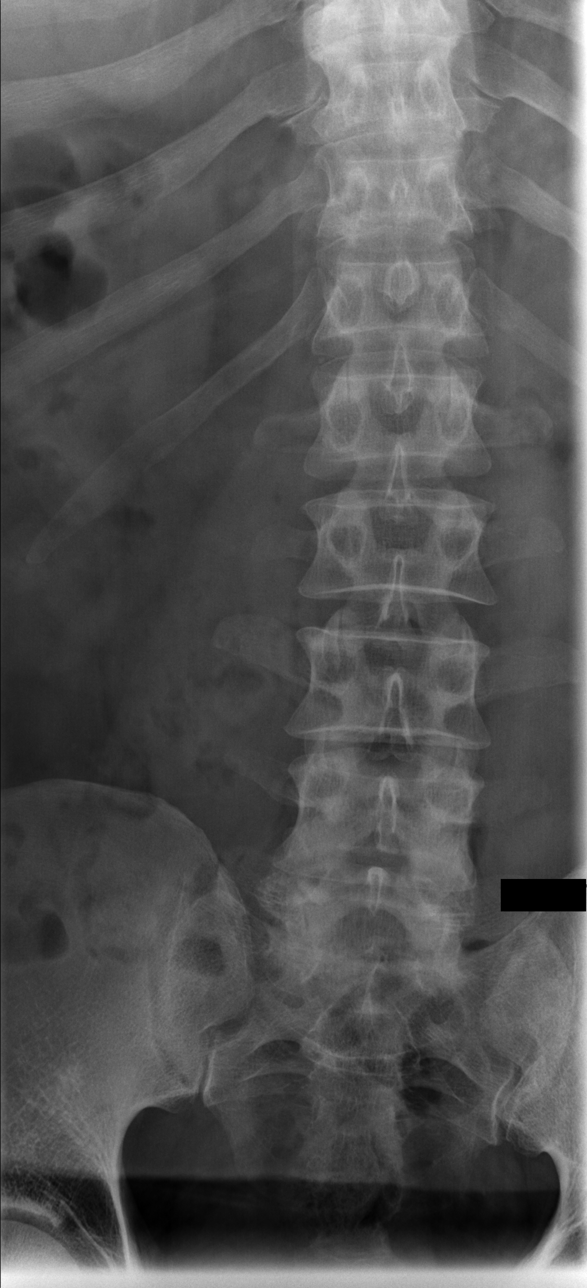

[t lumbar spine obl (2 of 2)]
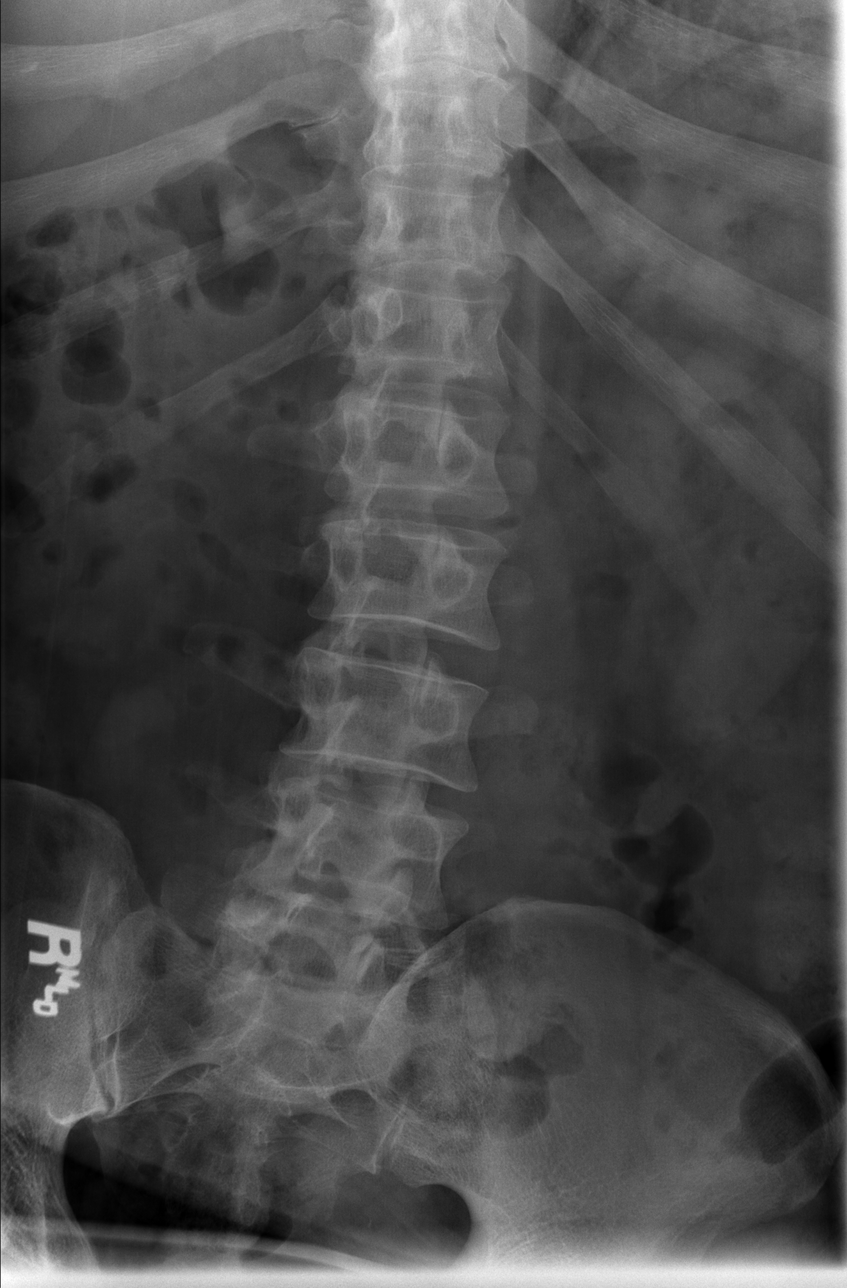

[t lumbar spine lat]
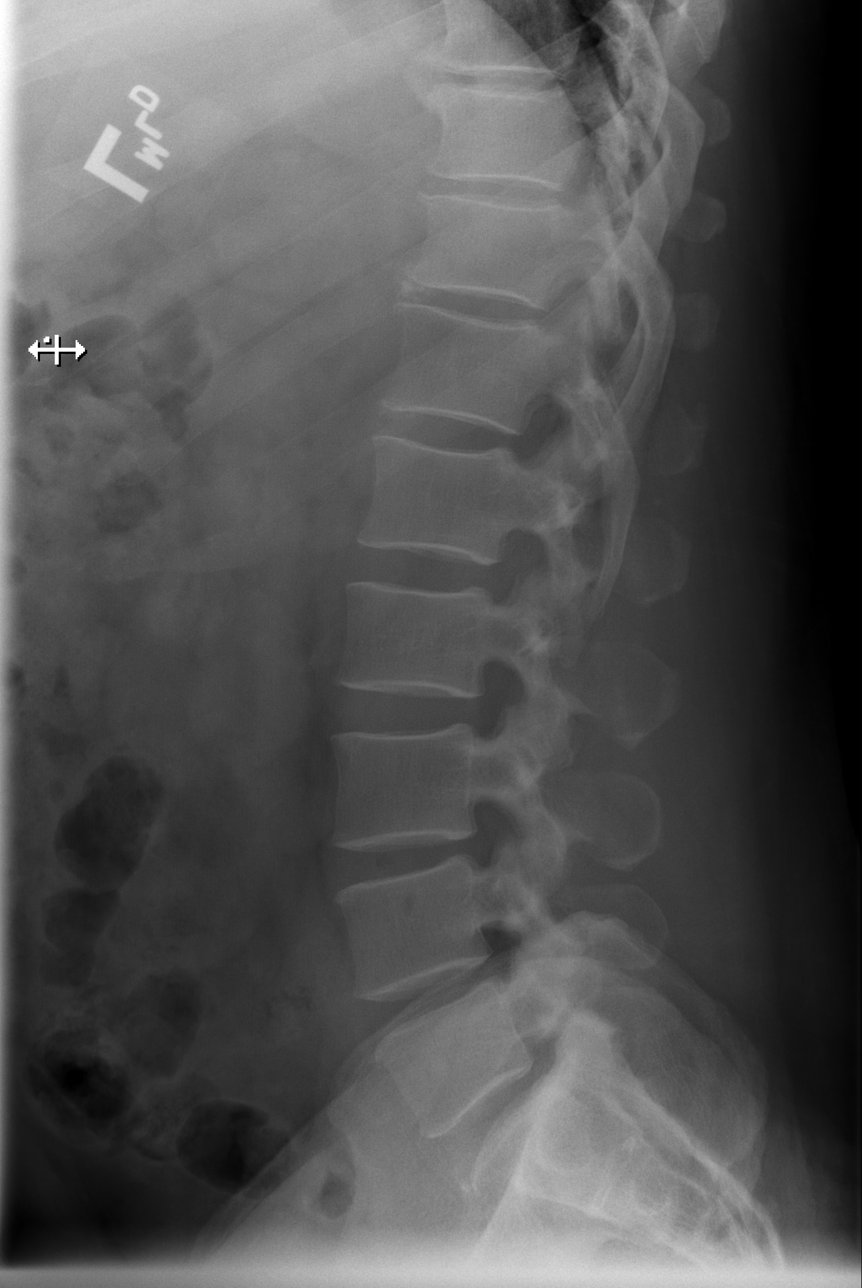

[t lumbar l-5 s-1 spot]
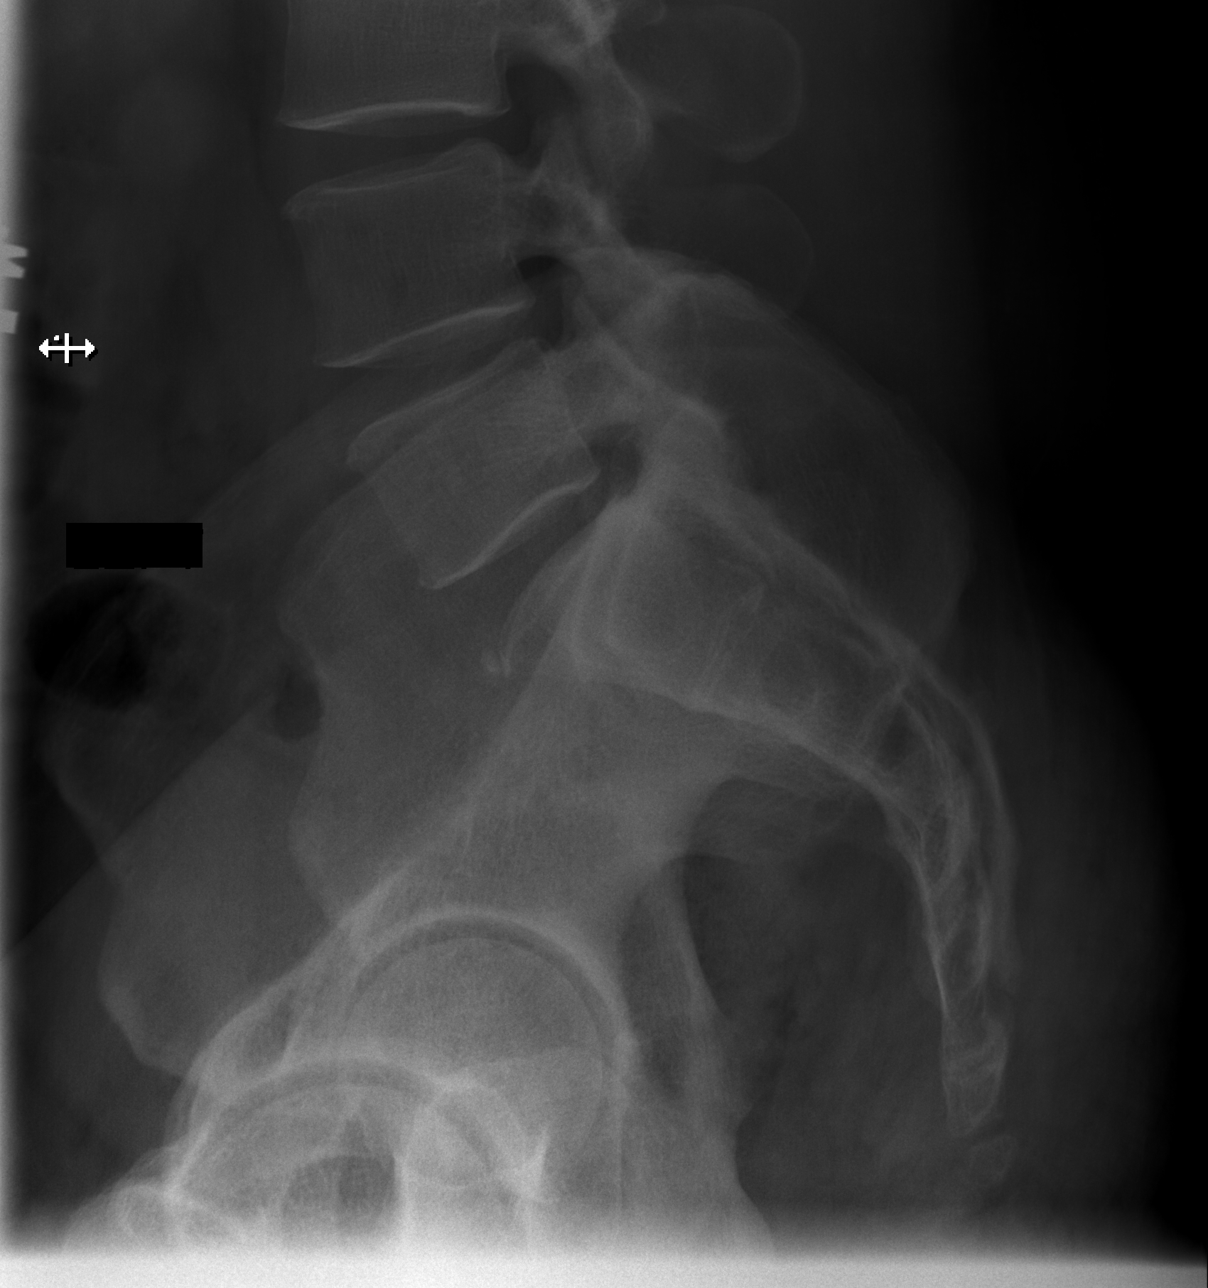

[5 of 5 positions shown; findings below may reference images not displayed]

FINDINGS: Five lumbar type vertebral bodies are well visualized. Mild disc
space narrowing at L4-5 is noted. No pars defects are seen. No
anterolisthesis is noted. No soft tissue abnormality is noted.
IMPRESSION: Mild degenerative change without acute abnormality.

## 2021-04-18 ENCOUNTER — Other Ambulatory Visit: Payer: Self-pay | Admitting: Psychiatry

## 2021-04-18 ENCOUNTER — Other Ambulatory Visit: Payer: Self-pay | Admitting: Behavioral Health

## 2021-04-18 ENCOUNTER — Telehealth: Payer: Self-pay | Admitting: Behavioral Health

## 2021-04-18 DIAGNOSIS — F411 Generalized anxiety disorder: Secondary | ICD-10-CM

## 2021-04-18 DIAGNOSIS — F3181 Bipolar II disorder: Secondary | ICD-10-CM

## 2021-04-18 MED ORDER — DIVALPROEX SODIUM 250 MG PO DR TAB: 750.0000 mg | DELAYED_RELEASE_TABLET | Freq: Two times a day (BID) | ORAL | 0 refills | Status: DC

## 2021-04-18 NOTE — Telephone Encounter (Signed)
Pt requesting refill for Depakote 250 mg DR tablet until apt 7/29 @ [facility].

## 2021-04-18 NOTE — Telephone Encounter (Signed)
I sent the script for 30 day supply but he must see me for more. Please inform patient. Thank you.

## 2021-04-22 ENCOUNTER — Ambulatory Visit (INDEPENDENT_AMBULATORY_CARE_PROVIDER_SITE_OTHER): Payer: PRIVATE HEALTH INSURANCE | Admitting: Behavioral Health

## 2021-04-22 ENCOUNTER — Encounter (INDEPENDENT_AMBULATORY_CARE_PROVIDER_SITE_OTHER): Payer: Self-pay

## 2021-04-22 ENCOUNTER — Other Ambulatory Visit: Payer: Self-pay

## 2021-04-22 ENCOUNTER — Encounter: Payer: Self-pay | Admitting: Behavioral Health

## 2021-04-22 DIAGNOSIS — F411 Generalized anxiety disorder: Secondary | ICD-10-CM | POA: Diagnosis not present

## 2021-04-22 DIAGNOSIS — F3181 Bipolar II disorder: Secondary | ICD-10-CM

## 2021-04-22 MED ORDER — DIVALPROEX SODIUM 250 MG PO DR TAB: 750.0000 mg | DELAYED_RELEASE_TABLET | Freq: Two times a day (BID) | ORAL | 3 refills | Status: DC

## 2021-04-22 NOTE — Progress Notes (Signed)
[facility]  Patient ID: William Wilkins,  MRN: [id number]  PCP: Heron Nay, PA  Date of Evaluation: 04/22/2021 Time spent:40 minutes  Chief Complaint:  Chief Complaint   Anxiety; Depression; Follow-up; Medication Refill     HISTORY/CURRENT STATUS: HPI 50 year old male presents to this office for follow up and medication management. He is former patient of retired Dr. Beverly Milch.  He said that he is basically here today for med check and to get refills for Depakote. Says he is doing great and is pleased that this medication has stabilized him. He says that he has not experienced mania in two years. Says he has had rough year with wife leaving him for another woman. Says his divorce is finalized in August, 2022. He denies anxiety or depression at this time. No mania present. No psychosis or SI/HI.  No prior medication failures noted this visit. Patient to bring list next f/u.    Individual Medical History/ Review of Systems: Changes? :No   Allergies: Other  Current Medications:  Current Outpatient Medications:    divalproex (DEPAKOTE) 250 MG DR tablet, Take 3 tablets (750 mg total) by mouth 2 (two) times daily., Disp: 180 tablet, Rfl: 3   LORazepam (ATIVAN) 0.5 MG tablet, Take 0.5 mg by mouth 3 (three) times daily as needed. For anxiety, Disp: , Rfl:  Medication Side Effects: none  Family Medical/ Social History: Changes? No  MENTAL HEALTH EXAM:  There were no vitals taken for this visit.There is no height or weight on file to calculate BMI.  General Appearance: Casual and Neat  Eye Contact:  Good  Speech:  Clear and Coherent and Talkative  Volume:  Normal  Mood:  NA  Affect:  Appropriate  Thought Process:  Coherent  Orientation:  Full (Time, Place, and Person)  Thought Content: Logical   Suicidal Thoughts:  No  Homicidal Thoughts:  No  Memory:  WNL  Judgement:  Good  Insight:  Good  Psychomotor Activity:  Normal  Concentration:   Concentration: Good  Recall:  Good  Fund of Knowledge: Good  Language: Good  Assets:  Desire for Improvement  ADL's:  Intact  Cognition: WNL  Prognosis:  Good    DIAGNOSES:    ICD-10-CM   1. Moderate bipolar II disorder, depressed, in partial remission, with atypical features (HCC)  F31.81 divalproex (DEPAKOTE) 250 MG DR tablet    2. Generalized anxiety disorder  F41.1 divalproex (DEPAKOTE) 250 MG DR tablet      Receiving Psychotherapy: No    RECOMMENDATIONS:  Continue Depakote 250 mg, take 3 tablets twice daily total 1500 mg daily. Reports any worsening symptoms promptly Follow up in 3 months for reassessment Provided emergency contact information Greater than 50% of  40 min face to face time with patient was spent on counseling and coordination of care. We discussed his long history of bipolar 2 disorder. Discussed recent changes in his personal life such as currently going through a difficult divorce. Patient is doing very well and functioning at high level. Working 40 plus hours per week. He does not want to make any medication adjustments at this time. Discussed importance of regular labs for Depakote level at least semi-annual. Last  lab on 08/21 was 93. He will be visiting his PCP next month for annual check up and says that he will have Depakote level checked at that time.    [name], NP

## 2021-04-29 ENCOUNTER — Other Ambulatory Visit: Payer: Self-pay | Admitting: Behavioral Health

## 2021-04-29 DIAGNOSIS — F3181 Bipolar II disorder: Secondary | ICD-10-CM

## 2021-04-29 DIAGNOSIS — F411 Generalized anxiety disorder: Secondary | ICD-10-CM

## 2021-05-03 ENCOUNTER — Other Ambulatory Visit: Payer: Self-pay | Admitting: Behavioral Health

## 2021-05-03 DIAGNOSIS — F411 Generalized anxiety disorder: Secondary | ICD-10-CM

## 2021-05-03 DIAGNOSIS — F3181 Bipolar II disorder: Secondary | ICD-10-CM

## 2021-05-31 ENCOUNTER — Other Ambulatory Visit: Payer: Self-pay | Admitting: Behavioral Health

## 2021-05-31 DIAGNOSIS — F3181 Bipolar II disorder: Secondary | ICD-10-CM

## 2021-05-31 DIAGNOSIS — F411 Generalized anxiety disorder: Secondary | ICD-10-CM

## 2021-05-31 NOTE — Telephone Encounter (Signed)
Patient lm inquiring about his pending refills. Last office visit was 7/29, with a follow up scheduled for 10/28.

## 2021-07-06 ENCOUNTER — Other Ambulatory Visit: Payer: Self-pay | Admitting: Behavioral Health

## 2021-07-06 DIAGNOSIS — F411 Generalized anxiety disorder: Secondary | ICD-10-CM

## 2021-07-06 DIAGNOSIS — F3181 Bipolar II disorder: Secondary | ICD-10-CM

## 2021-07-22 ENCOUNTER — Ambulatory Visit: Payer: PRIVATE HEALTH INSURANCE | Admitting: Behavioral Health

## 2021-07-25 ENCOUNTER — Encounter: Payer: Self-pay | Admitting: Behavioral Health

## 2021-07-25 ENCOUNTER — Other Ambulatory Visit: Payer: Self-pay

## 2021-07-25 ENCOUNTER — Ambulatory Visit (INDEPENDENT_AMBULATORY_CARE_PROVIDER_SITE_OTHER): Payer: PRIVATE HEALTH INSURANCE | Admitting: Behavioral Health

## 2021-07-25 DIAGNOSIS — F3181 Bipolar II disorder: Secondary | ICD-10-CM | POA: Diagnosis not present

## 2021-07-25 DIAGNOSIS — F411 Generalized anxiety disorder: Secondary | ICD-10-CM

## 2021-07-25 DIAGNOSIS — Z79899 Other long term (current) drug therapy: Secondary | ICD-10-CM | POA: Diagnosis not present

## 2021-07-25 MED ORDER — DIVALPROEX SODIUM 250 MG PO DR TAB: DELAYED_RELEASE_TABLET | ORAL | 0 refills | Status: DC

## 2021-07-25 NOTE — Progress Notes (Signed)
[facility]  Patient ID: William Wilkins,  MRN: [id number]  PCP: Heron Nay, PA  Date of Evaluation: 07/25/2021 Time spent:30 minutes  Chief Complaint:  Chief Complaint   Anxiety; Depression; Follow-up; Medication Refill     HISTORY/CURRENT STATUS: HPI 50 year old male presents to this office for follow up and medication management. He is former patient of retired Dr. Beverly Milch.  He said that he is basically here today for med check and to get refills for Depakote and would like to order labs . Says he is doing great and is pleased that this medication continues to keep him stabilized. He says that he has not experienced mania in two years. Says he has had rough year with wife leaving him for another woman. Says his divorce is finalized in August, 2022. He denies anxiety or depression at this time. No mania present. No psychosis or SI/HI.   No prior medication failures noted this visit. Patient to bring list next f/u.  Individual Medical History/ Review of Systems: Changes? :No   Allergies: Other  Current Medications:  Current Outpatient Medications:    divalproex (DEPAKOTE) 250 MG DR tablet, TAKE 3 TABLETS BY MOUTH 2 TIMES DAILY., Disp: 180 tablet, Rfl: 0   LORazepam (ATIVAN) 0.5 MG tablet, Take 0.5 mg by mouth 3 (three) times daily as needed. For anxiety, Disp: , Rfl:  Medication Side Effects: none  Family Medical/ Social History: Changes? No  MENTAL HEALTH EXAM:  There were no vitals taken for this visit.There is no height or weight on file to calculate BMI.  General Appearance: Casual  Eye Contact:  Good  Speech:  Clear and Coherent  Volume:  Normal  Mood:  NA  Affect:  Appropriate  Thought Process:  Coherent  Orientation:  Full (Time, Place, and Person)  Thought Content: Logical   Suicidal Thoughts:  No  Homicidal Thoughts:  No  Memory:  WNL  Judgement:  Good  Insight:  Good  Psychomotor Activity:  Normal  Concentration:  Concentration:  Good  Recall:  Good  Fund of Knowledge: Good  Language: Good  Assets:  Desire for Improvement  ADL's:  Intact  Cognition: WNL  Prognosis:  Good    DIAGNOSES:    ICD-10-CM   1. Moderate bipolar II disorder, depressed, in partial remission, with atypical features (HCC)  F31.81 divalproex (DEPAKOTE) 250 MG DR tablet    2. Generalized anxiety disorder  F41.1 divalproex (DEPAKOTE) 250 MG DR tablet    3. High risk medication use  Z79.899 Valproic acid level    CBC with Differential/Platelet    Comprehensive metabolic panel    Lipid panel    Hemoglobin A1c    TSH      Receiving Psychotherapy: No    RECOMMENDATIONS:  Continue Depakote 250 mg, take 3 tablets twice daily total 1500 mg daily. Reports any worsening symptoms promptly Follow up in 4 weeks for reassessment To have labs ordered completed before next visit.  Ordered: CBC, CMP, Lipids, A1C, Depakote levels, TSH.  Provided emergency contact information Greater than 50% of  30 min face to face time with patient was spent on counseling and coordination of care.We discussed his continued stability and improvement with symptoms associated with bipolar disorder. Discussed recent changes in his personal life such as currently going through a difficult divorce. Patient is doing very well and functioning at high level. Working 40 plus hours per week. He does not want to make any medication adjustments at this time.  He will follow up with PCP in 08/2021.        Elwanda Brooklyn, NP

## 2021-08-05 LAB — LIPID PANEL
Cholesterol: 149 mg/dL (ref <200)
HDL: 43 mg/dL (ref >=40)
LDL Cholesterol (Calc): 89 mg/dL (calc)
Non-HDL Cholesterol (Calc): 106 mg/dL (calc) (ref <130)
Total CHOL/HDL Ratio: 3.5 (calc) (ref <5.0)
Triglycerides: 77 mg/dL (ref <150)

## 2021-08-05 LAB — COMPREHENSIVE METABOLIC PANEL
AG Ratio: 1.8 (calc) (ref 1.0–2.5)
ALT: 64 U/L — ABNORMAL HIGH (ref 9–46)
AST: 45 U/L — ABNORMAL HIGH (ref 10–40)
Albumin: 4.7 g/dL (ref 3.6–5.1)
Alkaline phosphatase (APISO): 42 U/L (ref 36–130)
BUN: 20 mg/dL (ref 7–25)
CO2: 26 mmol/L (ref 20–32)
Calcium: 10 mg/dL (ref 8.6–10.3)
Chloride: 102 mmol/L (ref 98–110)
Creat: 0.96 mg/dL (ref 0.60–1.29)
Globulin: 2.6 g/dL (calc) (ref 1.9–3.7)
Glucose, Bld: 83 mg/dL (ref 65–99)
Potassium: 5 mmol/L (ref 3.5–5.3)
Sodium: 138 mmol/L (ref 135–146)
Total Bilirubin: 0.7 mg/dL (ref 0.2–1.2)
Total Protein: 7.3 g/dL (ref 6.1–8.1)

## 2021-08-05 LAB — CBC WITH DIFFERENTIAL/PLATELET
Absolute Monocytes: 634 cells/uL (ref 200–950)
Basophils Absolute: 20 cells/uL (ref 0–200)
Basophils Relative: 0.3 %
Eosinophils Absolute: 158 cells/uL (ref 15–500)
Eosinophils Relative: 2.4 %
HCT: 49.3 % (ref 38.5–50.0)
Hemoglobin: 16.7 g/dL (ref 13.2–17.1)
Lymphs Abs: 2831 cells/uL (ref 850–3900)
MCH: 30.6 pg (ref 27.0–33.0)
MCHC: 33.9 g/dL (ref 32.0–36.0)
MCV: 90.5 fL (ref 80.0–100.0)
MPV: 10.8 fL (ref 7.5–12.5)
Monocytes Relative: 9.6 %
Neutro Abs: 2957 cells/uL (ref 1500–7800)
Neutrophils Relative %: 44.8 %
Platelets: 254 10*3/uL (ref 140–400)
RBC: 5.45 10*6/uL (ref 4.20–5.80)
RDW: 12.6 % (ref 11.0–15.0)
Total Lymphocyte: 42.9 %
WBC: 6.6 10*3/uL (ref 3.8–10.8)

## 2021-08-05 LAB — HEMOGLOBIN A1C
Hgb A1c MFr Bld: 5.7 % of total Hgb — ABNORMAL HIGH (ref <5.7)
Mean Plasma Glucose: 117 mg/dL
eAG (mmol/L): 6.5 mmol/L

## 2021-08-05 LAB — TSH: TSH: 2.38 mIU/L (ref 0.40–4.50)

## 2021-08-05 LAB — VALPROIC ACID LEVEL: Valproic Acid Lvl: 76.7 mg/L (ref 50.0–100.0)

## 2021-08-22 ENCOUNTER — Ambulatory Visit (INDEPENDENT_AMBULATORY_CARE_PROVIDER_SITE_OTHER): Payer: PRIVATE HEALTH INSURANCE | Admitting: Behavioral Health

## 2021-08-22 ENCOUNTER — Encounter: Payer: Self-pay | Admitting: Behavioral Health

## 2021-08-22 ENCOUNTER — Other Ambulatory Visit: Payer: Self-pay

## 2021-08-22 DIAGNOSIS — F411 Generalized anxiety disorder: Secondary | ICD-10-CM | POA: Diagnosis not present

## 2021-08-22 DIAGNOSIS — Z79899 Other long term (current) drug therapy: Secondary | ICD-10-CM

## 2021-08-22 DIAGNOSIS — F3181 Bipolar II disorder: Secondary | ICD-10-CM | POA: Diagnosis not present

## 2021-08-22 MED ORDER — DIVALPROEX SODIUM 250 MG PO DR TAB: DELAYED_RELEASE_TABLET | ORAL | 0 refills | Status: DC

## 2021-08-22 MED ORDER — LORAZEPAM 0.5 MG PO TABS
0.5000 mg | ORAL_TABLET | Freq: Three times a day (TID) | ORAL | 1 refills | Status: DC | PRN
Start: 2021-08-22 — End: 2022-03-07

## 2021-08-22 MED ORDER — DIVALPROEX SODIUM 250 MG PO DR TAB: DELAYED_RELEASE_TABLET | ORAL | 3 refills | Status: DC

## 2021-08-22 NOTE — Progress Notes (Signed)
[facility]  Patient ID: William Wilkins,  MRN: [id number]  PCP: Heron Nay, PA  Date of Evaluation: 08/22/2021 Time spent:30 minutes  Chief Complaint:  Chief Complaint   Depression; Anxiety; Medication Refill     HISTORY/CURRENT STATUS: HPI 50 year old male presents to this office for follow up and medication management. He is former patient of retired Dr. Beverly Milch.  He said that he is basically here today for med check and to get refills for Depakote and would like to discuss his recent labs. Says he is doing great and is pleased that this medication continues to keep him stabilized. He says that he has not experienced mania in two years. Says he has had rough year with wife leaving him for another woman but he is now adjusting to life the best he can. He occasion gets stressed at his job due to working 7 days per week. He says he know he needs to take a break soon. He denies anxiety or depression at this time. No mania present. No psychosis or SI/HI.   No prior medication failures noted this visit. Patient to bring list next f/u.     Individual Medical History/ Review of Systems: Changes? :No   Allergies: Other  Current Medications:  Current Outpatient Medications:    divalproex (DEPAKOTE) 250 MG DR tablet, TAKE 3 TABLETS BY MOUTH 2 TIMES DAILY., Disp: 180 tablet, Rfl: 3   LORazepam (ATIVAN) 0.5 MG tablet, Take 1 tablet (0.5 mg total) by mouth 3 (three) times daily as needed. For anxiety, Disp: 30 tablet, Rfl: 1 Medication Side Effects: none  Family Medical/ Social History: Changes? No  MENTAL HEALTH EXAM:  There were no vitals taken for this visit.There is no height or weight on file to calculate BMI.  General Appearance: Casual, Neat, and Well Groomed  Eye Contact:  Good  Speech:  Clear and Coherent  Volume:  Normal  Mood:  Anxious and Depressed  Affect:  Appropriate  Thought Process:  Coherent  Orientation:  Full (Time, Place, and Person)   Thought Content: Logical   Suicidal Thoughts:  No  Homicidal Thoughts:  No  Memory:  WNL  Judgement:  Good  Insight:  Good  Psychomotor Activity:  Normal  Concentration:  Concentration: Good  Recall:  Good  Fund of Knowledge: Good  Language: Good  Assets:  Desire for Improvement  ADL's:  Intact  Cognition: WNL  Prognosis:  Good    DIAGNOSES:    ICD-10-CM   1. Moderate bipolar II disorder, depressed, in partial remission, with atypical features (HCC)  F31.81 divalproex (DEPAKOTE) 250 MG DR tablet    DISCONTINUED: divalproex (DEPAKOTE) 250 MG DR tablet    2. Generalized anxiety disorder  F41.1 divalproex (DEPAKOTE) 250 MG DR tablet    DISCONTINUED: divalproex (DEPAKOTE) 250 MG DR tablet    3. High risk medication use  Z79.899       Receiving Psychotherapy: No    RECOMMENDATIONS:   Continue Depakote 250 mg, take 3 tablets twice daily total 1500 mg daily. Reports any worsening symptoms promptly Follow up in 4 weeks for reassessment Provided emergency contact information  Provided emergency contact information Greater than 50% of  30 min face to face time with patient was spent on counseling and coordination of care.We discussed his continued stability and improvement with symptoms associated with bipolar disorder. Discussed his current stressors on his job. Patient is doing very well and functioning at high level. Working 40 plus hours per  week. Advised him that he should monitor his AST and ALT with PCP because they were both elevated. Did show improvement from last year. Discussed possible medications that may cause.  He does not want to make any medication adjustments at this time. He will follow up with PCP in 08/2021.    \   Elwanda Brooklyn, NP

## 2022-02-21 ENCOUNTER — Ambulatory Visit: Payer: PRIVATE HEALTH INSURANCE | Admitting: Behavioral Health

## 2022-03-07 ENCOUNTER — Ambulatory Visit (INDEPENDENT_AMBULATORY_CARE_PROVIDER_SITE_OTHER): Payer: PRIVATE HEALTH INSURANCE | Admitting: Behavioral Health

## 2022-03-07 ENCOUNTER — Encounter: Payer: Self-pay | Admitting: Behavioral Health

## 2022-03-07 DIAGNOSIS — Z79899 Other long term (current) drug therapy: Secondary | ICD-10-CM

## 2022-03-07 DIAGNOSIS — F411 Generalized anxiety disorder: Secondary | ICD-10-CM | POA: Diagnosis not present

## 2022-03-07 DIAGNOSIS — F3181 Bipolar II disorder: Secondary | ICD-10-CM | POA: Diagnosis not present

## 2022-03-07 MED ORDER — DIVALPROEX SODIUM 250 MG PO DR TAB: DELAYED_RELEASE_TABLET | ORAL | 5 refills | Status: DC

## 2022-03-07 NOTE — Progress Notes (Signed)
[facility]  Patient ID: William Wilkins,  MRN: [id number]  PCP: Heron Nay, PA  Date of Evaluation: 03/07/2022 Time spent:30 minutes  Chief Complaint:  Chief Complaint   Anxiety; Depression; Follow-up; Medication Refill     HISTORY/CURRENT STATUS: HPI  51 year old male presents to this office for follow up and medication management. No changes this visit. He said that he is basically here today for med check and to get refills for Depakote.  Says he is doing great and is pleased that this medication continues to keep him stabilized. He says that he has not experienced mania in two years. 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 He occasionally gets stressed at his job due to working 7 days per week.  He denies anxiety or depression at this time. No mania present. No psychosis or SI/HI.   No prior medication failures noted this visit. Patient to bring list next f/u.     Individual Medical History/ Review of Systems: Changes? :No   Allergies: Other  Current Medications:  Current Outpatient Medications:    divalproex (DEPAKOTE) 250 MG DR tablet, TAKE 3 TABLETS BY MOUTH 2 TIMES DAILY., Disp: 180 tablet, Rfl: 5 Medication Side Effects: none  Family Medical/ Social History: Changes? No  MENTAL HEALTH EXAM:  There were no vitals taken for this visit.There is no height or weight on file to calculate BMI.  General Appearance: Casual, Neat, and Well Groomed  Eye Contact:  Good  Speech:  Clear and Coherent  Volume:  Normal  Mood:  NA  Affect:  Appropriate  Thought Process:  Coherent  Orientation:  Full (Time, Place, and Person)  Thought Content: Logical   Suicidal Thoughts:  No  Homicidal Thoughts:  No  Memory:  WNL  Judgement:  Good  Insight:  Good  Psychomotor Activity:  Normal  Concentration:  Concentration: Good  Recall:  Good  Fund of Knowledge: Good  Language: Good  Assets:  Desire for Improvement  ADL's:   Intact  Cognition: WNL  Prognosis:  Good    DIAGNOSES:    ICD-10-CM   1. Moderate bipolar II disorder, depressed, in partial remission, with atypical features (HCC)  F31.81 divalproex (DEPAKOTE) 250 MG DR tablet    2. Generalized anxiety disorder  F41.1 divalproex (DEPAKOTE) 250 MG DR tablet    3. High risk medication use  Z79.899 Valproic acid level      Receiving Psychotherapy: No    RECOMMENDATIONS  Continue Depakote 250 mg, take 3 tablets twice daily total 750 mg daily. Reports any worsening symptoms promptly Follow up in 6 months for reassessment Provided emergency contact information   Provided emergency contact information Greater than 50% of  30 min face to face time with patient was spent on counseling and coordination of care.We discussed his continued stability and improvement with symptoms associated with bipolar disorder. Discussed his current stressors on his job. Patient is doing very well and functioning at high level. Working 40 plus hours per week.     He does not want to make any medication adjustments at this time.      [name], NP

## 2022-07-17 ENCOUNTER — Other Ambulatory Visit: Payer: Self-pay

## 2022-07-17 ENCOUNTER — Telehealth: Payer: Self-pay | Admitting: Behavioral Health

## 2022-07-17 DIAGNOSIS — F411 Generalized anxiety disorder: Secondary | ICD-10-CM

## 2022-07-17 DIAGNOSIS — F3181 Bipolar II disorder: Secondary | ICD-10-CM

## 2022-07-17 MED ORDER — DIVALPROEX SODIUM 250 MG PO DR TAB: DELAYED_RELEASE_TABLET | ORAL | 0 refills | Status: DC

## 2022-07-17 NOTE — Telephone Encounter (Signed)
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 Pt # [phone]. Note chart for RF due soon.

## 2022-07-17 NOTE — Telephone Encounter (Signed)
Rx sent 

## 2022-08-10 ENCOUNTER — Other Ambulatory Visit: Payer: Self-pay | Admitting: Behavioral Health

## 2022-08-10 DIAGNOSIS — F3181 Bipolar II disorder: Secondary | ICD-10-CM

## 2022-08-10 DIAGNOSIS — F411 Generalized anxiety disorder: Secondary | ICD-10-CM

## 2022-09-06 ENCOUNTER — Encounter: Payer: Self-pay | Admitting: Behavioral Health

## 2022-09-06 ENCOUNTER — Ambulatory Visit (INDEPENDENT_AMBULATORY_CARE_PROVIDER_SITE_OTHER): Payer: 59 | Admitting: Behavioral Health

## 2022-09-06 DIAGNOSIS — Z79899 Other long term (current) drug therapy: Secondary | ICD-10-CM

## 2022-09-06 DIAGNOSIS — F3181 Bipolar II disorder: Secondary | ICD-10-CM | POA: Diagnosis not present

## 2022-09-06 DIAGNOSIS — F411 Generalized anxiety disorder: Secondary | ICD-10-CM | POA: Diagnosis not present

## 2022-09-06 MED ORDER — DIVALPROEX SODIUM 250 MG PO DR TAB: DELAYED_RELEASE_TABLET | ORAL | 4 refills | Status: DC

## 2022-09-06 NOTE — Progress Notes (Signed)
[facility]  Patient ID: William Wilkins,  MRN: [id number]  PCP: Heron Nay, PA  Date of Evaluation: 09/06/2022 Time spent:30 minutes  Chief Complaint:  Chief Complaint   Anxiety; Depression; Follow-up; Medication Refill; Patient Education     HISTORY/CURRENT STATUS: HPI  51 year old male presents to this office for follow up and medication management. No changes this visit. He said that he is basically here today for med check and to get refills for Depakote.  Need Depakote level labs ordered. Says he is doing great and is pleased that this medication continues to keep him stabilized. He says that he has not experienced mania in three years. He is now living and working in [place]. Very involved in church.  He occasionally gets stressed at his job due to working 7 days per week.  He denies anxiety or depression at this time. No mania present. No psychosis or SI/HI.   No prior medication failures noted this visit. Patient to bring list next f/u.     Individual Medical History/ Review of Systems: Changes? :No   Allergies: Other  Current Medications:  Current Outpatient Medications:    divalproex (DEPAKOTE) 250 MG DR tablet, TAKE 3 TABLETS BY MOUTH TWICE A DAY, Disp: 540 tablet, Rfl: 4 Medication Side Effects: none  Family Medical/ Social History: Changes? No  MENTAL HEALTH EXAM:  There were no vitals taken for this visit.There is no height or weight on file to calculate BMI.  General Appearance: Casual, Neat, and Well Groomed  Eye Contact:  Good  Speech:  Clear and Coherent  Volume:  Increased  Mood:  NA  Affect:  Appropriate  Thought Process:  Coherent  Orientation:  Full (Time, Place, and Person)  Thought Content: Logical   Suicidal Thoughts:  No  Homicidal Thoughts:  No  Memory:  WNL  Judgement:  Good  Insight:  Good  Psychomotor Activity:  Normal  Concentration:  Concentration: Good  Recall:  Good  Fund of Knowledge: Good  Language:  Good  Assets:  Desire for Improvement  ADL's:  Intact  Cognition: WNL  Prognosis:  Good    DIAGNOSES:    ICD-10-CM   1. High risk medication use  Z79.899 Valproic acid level    2. Generalized anxiety disorder  F41.1 divalproex (DEPAKOTE) 250 MG DR tablet    3. Moderate bipolar II disorder, depressed, in partial remission, with atypical features (HCC)  F31.81 divalproex (DEPAKOTE) 250 MG DR tablet      Receiving Psychotherapy: No    RECOMMENDATIONS:   Continue Depakote 250 mg, take 3 tablets twice daily total 750 mg daily. Reports any worsening symptoms promptly Follow up in 1 year for reassessment Provided emergency contact information   Provided emergency contact information Greater than 50% of  30 min face to face time with patient was spent on counseling and coordination of care.We discussed his continued stability and improvement with symptoms associated with bipolar disorder. Discussed his current stressors on his job. Patient is doing very well and functioning at high level. Working 40 plus hours per week.   Now living in [place] but would like to maintain care at this office.   He does not want to make any medication adjustments at this time.       [name], NP

## 2023-09-07 ENCOUNTER — Ambulatory Visit (INDEPENDENT_AMBULATORY_CARE_PROVIDER_SITE_OTHER): Payer: 59 | Admitting: Behavioral Health

## 2023-09-07 ENCOUNTER — Encounter: Payer: Self-pay | Admitting: Behavioral Health

## 2023-09-07 DIAGNOSIS — Z79899 Other long term (current) drug therapy: Secondary | ICD-10-CM

## 2023-09-07 DIAGNOSIS — F3181 Bipolar II disorder: Secondary | ICD-10-CM

## 2023-09-07 DIAGNOSIS — F411 Generalized anxiety disorder: Secondary | ICD-10-CM

## 2023-09-07 MED ORDER — DIVALPROEX SODIUM 250 MG PO DR TAB: DELAYED_RELEASE_TABLET | ORAL | 5 refills | Status: AC

## 2023-09-07 NOTE — Progress Notes (Signed)
[facility]  Patient ID: William Wilkins,  MRN: [id number]  PCP: Heron Nay, PA  Date of Evaluation: 09/07/2023 Time spent:30 minutes  Chief Complaint:  Chief Complaint   Anxiety; Depression; Follow-up; Patient Education; Medication Refill     HISTORY/CURRENT STATUS: HPI  52 year old male presents to this office for follow up and medication management. No changes this visit. He said that he is basically here today for med check and to get refills for Depakote.  Need Depakote level labs ordered. Says he is doing great and is pleased that this medication continues to keep him stabilized. He says that he has not experienced mania in three nearly 4 years now.  He is now living and working in [place]. Very involved in church. 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  He denies anxiety or depression at this time. No mania present. No psychosis or SI/HI.   No prior medication failures noted this visit. Patient to bring list next f/u.     Individual Medical History/ Review of Systems: Changes? :No   Allergies: Other  Current Medications:  Current Outpatient Medications:    divalproex (DEPAKOTE) 250 MG DR tablet, TAKE 3 TABLETS BY MOUTH IN THE MORNING AND THREE TABLET AT BEDTIME, Disp: 180 tablet, Rfl: 5 Medication Side Effects: none  Family Medical/ Social History: Changes? No  MENTAL HEALTH EXAM:  There were no vitals taken for this visit.There is no height or weight on file to calculate BMI.  General Appearance: Casual and Neat  Eye Contact:  Good  Speech:  Clear and Coherent  Volume:  Normal  Mood:  NA  Affect:  Appropriate  Thought Process:  Coherent  Orientation:  Full (Time, Place, and Person)  Thought Content: WDL   Suicidal Thoughts:  No  Homicidal Thoughts:  No  Memory:  WNL  Judgement:  Good  Insight:  Good  Psychomotor Activity:   Normal  Concentration:  Concentration: Good  Recall:  Good  Fund of Knowledge: Good  Language: Good  Assets:  Desire for Improvement  ADL's:  Intact  Cognition: WNL  Prognosis:  Good    DIAGNOSES:    ICD-10-CM   1. Generalized anxiety disorder  F41.1 divalproex (DEPAKOTE) 250 MG DR tablet    2. Moderate bipolar II disorder, depressed, in partial remission, with atypical features (HCC)  F31.81 divalproex (DEPAKOTE) 250 MG DR tablet      Receiving Psychotherapy: No    RECOMMENDATIONS:   Continue Depakote 250 mg, take 3 tablets twice daily total 1500 mg daily. Reports any worsening symptoms promptly Follow up in 6 months for reassessment Provided emergency contact information   Provided emergency contact information Greater than 50% of  30 min face to face time with patient was spent on counseling and coordination of care.We discussed his continued stability and improvement with symptoms associated with bipolar disorder. Discussed his current stressors on his job. Patient is doing very well and functioning at high level. Working 40 plus hours per week.   Now living in [place] but would like to maintain care at this office.   He does not want to make any medication adjustments at this time.       [name], NP

## 2024-03-07 ENCOUNTER — Ambulatory Visit (INDEPENDENT_AMBULATORY_CARE_PROVIDER_SITE_OTHER): Payer: 59 | Admitting: Behavioral Health

## 2024-03-07 DIAGNOSIS — Z0389 Encounter for observation for other suspected diseases and conditions ruled out: Secondary | ICD-10-CM

## 2024-03-07 NOTE — Progress Notes (Signed)
 Pt did not show for scheduled appt and did not provide 24 hour notice as required. Additional fees to be assessed.
